# Patient Record
Sex: Male | Born: 1955 | Race: Black or African American | Hispanic: No | Marital: Single | Smoking: Never smoker
Health system: Southern US, Community
[De-identification: ages and names within clinical notes are randomized; demographics above are authoritative.]

## PROBLEM LIST (undated history)

## (undated) DIAGNOSIS — I10 Essential (primary) hypertension: Secondary | ICD-10-CM

## (undated) DIAGNOSIS — H409 Unspecified glaucoma: Secondary | ICD-10-CM

## (undated) DIAGNOSIS — E119 Type 2 diabetes mellitus without complications: Secondary | ICD-10-CM

## (undated) HISTORY — PX: EYE SURGERY: SHX253

## (undated) HISTORY — PX: HERNIA REPAIR: SHX51

---

## 2019-05-25 ENCOUNTER — Emergency Department (HOSPITAL_COMMUNITY): Payer: Medicaid Other

## 2019-05-25 ENCOUNTER — Observation Stay (HOSPITAL_COMMUNITY): Payer: Medicaid Other

## 2019-05-25 ENCOUNTER — Encounter (HOSPITAL_COMMUNITY): Payer: Self-pay | Admitting: Neurology

## 2019-05-25 ENCOUNTER — Observation Stay (HOSPITAL_COMMUNITY)
Admission: EM | Admit: 2019-05-25 | Discharge: 2019-05-26 | Disposition: A | Discharge status: Home / Self Care | Payer: Medicaid Other | Attending: Internal Medicine | Admitting: Internal Medicine

## 2019-05-25 DIAGNOSIS — R079 Chest pain, unspecified: Secondary | ICD-10-CM

## 2019-05-25 DIAGNOSIS — R531 Weakness: Secondary | ICD-10-CM | POA: Insufficient documentation

## 2019-05-25 DIAGNOSIS — I160 Hypertensive urgency: Secondary | ICD-10-CM | POA: Diagnosis not present

## 2019-05-25 DIAGNOSIS — F1910 Other psychoactive substance abuse, uncomplicated: Secondary | ICD-10-CM | POA: Insufficient documentation

## 2019-05-25 DIAGNOSIS — M25562 Pain in left knee: Secondary | ICD-10-CM | POA: Diagnosis not present

## 2019-05-25 DIAGNOSIS — W1830XA Fall on same level, unspecified, initial encounter: Secondary | ICD-10-CM | POA: Insufficient documentation

## 2019-05-25 DIAGNOSIS — N179 Acute kidney failure, unspecified: Secondary | ICD-10-CM | POA: Diagnosis not present

## 2019-05-25 DIAGNOSIS — Z0184 Encounter for antibody response examination: Secondary | ICD-10-CM | POA: Insufficient documentation

## 2019-05-25 DIAGNOSIS — I10 Essential (primary) hypertension: Secondary | ICD-10-CM | POA: Insufficient documentation

## 2019-05-25 DIAGNOSIS — R202 Paresthesia of skin: Secondary | ICD-10-CM | POA: Diagnosis not present

## 2019-05-25 DIAGNOSIS — R197 Diarrhea, unspecified: Secondary | ICD-10-CM | POA: Diagnosis not present

## 2019-05-25 DIAGNOSIS — H42 Glaucoma in diseases classified elsewhere: Secondary | ICD-10-CM | POA: Diagnosis not present

## 2019-05-25 DIAGNOSIS — N400 Enlarged prostate without lower urinary tract symptoms: Secondary | ICD-10-CM

## 2019-05-25 DIAGNOSIS — Z791 Long term (current) use of non-steroidal anti-inflammatories (NSAID): Secondary | ICD-10-CM | POA: Insufficient documentation

## 2019-05-25 DIAGNOSIS — Z79899 Other long term (current) drug therapy: Secondary | ICD-10-CM | POA: Diagnosis not present

## 2019-05-25 DIAGNOSIS — Z8249 Family history of ischemic heart disease and other diseases of the circulatory system: Secondary | ICD-10-CM | POA: Diagnosis not present

## 2019-05-25 DIAGNOSIS — R0789 Other chest pain: Secondary | ICD-10-CM | POA: Diagnosis not present

## 2019-05-25 DIAGNOSIS — E1139 Type 2 diabetes mellitus with other diabetic ophthalmic complication: Secondary | ICD-10-CM | POA: Insufficient documentation

## 2019-05-25 DIAGNOSIS — Z20828 Contact with and (suspected) exposure to other viral communicable diseases: Secondary | ICD-10-CM | POA: Diagnosis not present

## 2019-05-25 DIAGNOSIS — R338 Other retention of urine: Secondary | ICD-10-CM | POA: Diagnosis not present

## 2019-05-25 DIAGNOSIS — R55 Syncope and collapse: Secondary | ICD-10-CM | POA: Diagnosis not present

## 2019-05-25 DIAGNOSIS — E1165 Type 2 diabetes mellitus with hyperglycemia: Secondary | ICD-10-CM | POA: Diagnosis not present

## 2019-05-25 DIAGNOSIS — R739 Hyperglycemia, unspecified: Secondary | ICD-10-CM

## 2019-05-25 DIAGNOSIS — N289 Disorder of kidney and ureter, unspecified: Secondary | ICD-10-CM

## 2019-05-25 DIAGNOSIS — F1410 Cocaine abuse, uncomplicated: Secondary | ICD-10-CM | POA: Diagnosis present

## 2019-05-25 DIAGNOSIS — M25561 Pain in right knee: Secondary | ICD-10-CM | POA: Diagnosis not present

## 2019-05-25 DIAGNOSIS — N401 Enlarged prostate with lower urinary tract symptoms: Secondary | ICD-10-CM | POA: Diagnosis not present

## 2019-05-25 HISTORY — DX: Unspecified glaucoma: H40.9

## 2019-05-25 HISTORY — DX: Type 2 diabetes mellitus without complications: E11.9

## 2019-05-25 HISTORY — DX: Essential (primary) hypertension: I10

## 2019-05-25 LAB — DIFFERENTIAL
Abs Immature Granulocytes: 0.01 10*3/uL (ref 0.00–0.07)
Basophils Absolute: 0 10*3/uL (ref 0.0–0.1)
Basophils Relative: 1 %
Eosinophils Absolute: 0.1 10*3/uL (ref 0.0–0.5)
Eosinophils Relative: 2 %
Immature Granulocytes: 0 %
Lymphocytes Relative: 19 %
Lymphs Abs: 0.9 10*3/uL (ref 0.7–4.0)
Monocytes Absolute: 0.8 10*3/uL (ref 0.1–1.0)
Monocytes Relative: 16 %
Neutro Abs: 3 10*3/uL (ref 1.7–7.7)
Neutrophils Relative %: 62 %

## 2019-05-25 LAB — RAPID URINE DRUG SCREEN, HOSP PERFORMED
Amphetamines: NOT DETECTED
Barbiturates: NOT DETECTED
Benzodiazepines: NOT DETECTED
Cocaine: POSITIVE — AB
Opiates: NOT DETECTED
Tetrahydrocannabinol: NOT DETECTED

## 2019-05-25 LAB — I-STAT CHEM 8, ED
BUN: 30 mg/dL — ABNORMAL HIGH (ref 8–23)
Calcium, Ion: 1.18 mmol/L (ref 1.15–1.40)
Chloride: 106 mmol/L (ref 98–111)
Creatinine, Ser: 1.6 mg/dL — ABNORMAL HIGH (ref 0.61–1.24)
Glucose, Bld: 161 mg/dL — ABNORMAL HIGH (ref 70–99)
HCT: 33 % — ABNORMAL LOW (ref 39.0–52.0)
Hemoglobin: 11.2 g/dL — ABNORMAL LOW (ref 13.0–17.0)
Potassium: 4.3 mmol/L (ref 3.5–5.1)
Sodium: 138 mmol/L (ref 135–145)
TCO2: 23 mmol/L (ref 22–32)

## 2019-05-25 LAB — COMPREHENSIVE METABOLIC PANEL
ALT: 25 U/L (ref 0–44)
AST: 31 U/L (ref 15–41)
Albumin: 4.2 g/dL (ref 3.5–5.0)
Alkaline Phosphatase: 76 U/L (ref 38–126)
Anion gap: 9 (ref 5–15)
BUN: 31 mg/dL — ABNORMAL HIGH (ref 8–23)
CO2: 23 mmol/L (ref 22–32)
Calcium: 9.3 mg/dL (ref 8.9–10.3)
Chloride: 105 mmol/L (ref 98–111)
Creatinine, Ser: 1.67 mg/dL — ABNORMAL HIGH (ref 0.61–1.24)
GFR calc Af Amer: 50 mL/min — ABNORMAL LOW (ref >60)
GFR calc non Af Amer: 43 mL/min — ABNORMAL LOW (ref >60)
Glucose, Bld: 169 mg/dL — ABNORMAL HIGH (ref 70–99)
Potassium: 4.3 mmol/L (ref 3.5–5.1)
Sodium: 137 mmol/L (ref 135–145)
Total Bilirubin: 0.8 mg/dL (ref 0.3–1.2)
Total Protein: 6.9 g/dL (ref 6.5–8.1)

## 2019-05-25 LAB — CK: Total CK: 401 U/L — ABNORMAL HIGH (ref 49–397)

## 2019-05-25 LAB — CBC
HCT: 32.1 % — ABNORMAL LOW (ref 39.0–52.0)
Hemoglobin: 10.1 g/dL — ABNORMAL LOW (ref 13.0–17.0)
MCH: 25.1 pg — ABNORMAL LOW (ref 26.0–34.0)
MCHC: 31.5 g/dL (ref 30.0–36.0)
MCV: 79.9 fL — ABNORMAL LOW (ref 80.0–100.0)
Platelets: 275 10*3/uL (ref 150–400)
RBC: 4.02 MIL/uL — ABNORMAL LOW (ref 4.22–5.81)
RDW: 13.8 % (ref 11.5–15.5)
WBC: 4.7 10*3/uL (ref 4.0–10.5)
nRBC: 0 % (ref 0.0–0.2)

## 2019-05-25 LAB — APTT: aPTT: 25 seconds (ref 24–36)

## 2019-05-25 LAB — TROPONIN I (HIGH SENSITIVITY)
Troponin I (High Sensitivity): 8 ng/L (ref <18)
Troponin I (High Sensitivity): 8 ng/L (ref <18)

## 2019-05-25 LAB — PROTIME-INR
INR: 1.1 (ref 0.8–1.2)
Prothrombin Time: 13.6 seconds (ref 11.4–15.2)

## 2019-05-25 LAB — CBG MONITORING, ED: Glucose-Capillary: 163 mg/dL — ABNORMAL HIGH (ref 70–99)

## 2019-05-25 LAB — ETHANOL: Alcohol, Ethyl (B): <10 mg/dL (ref <10)

## 2019-05-25 LAB — SARS CORONAVIRUS 2 BY RT PCR (HOSPITAL ORDER, PERFORMED IN ~~LOC~~ HOSPITAL LAB): SARS Coronavirus 2: NEGATIVE

## 2019-05-25 MED ORDER — SODIUM CHLORIDE 0.9% FLUSH: 3.0000 mL | Freq: Once | INTRAVENOUS | Status: DC

## 2019-05-25 MED ORDER — ONDANSETRON HCL 4 MG/2ML IJ SOLN: 4.0000 mg | Freq: Four times a day (QID) | INTRAMUSCULAR | Status: DC | PRN

## 2019-05-25 MED ORDER — DICLOFENAC SODIUM 1 % TD GEL
2.0000 g | Freq: Four times a day (QID) | TRANSDERMAL | Status: DC
  Administered 2019-05-26 (×2): 2 g via TOPICAL
  Filled 2019-05-25 (×2): qty 100

## 2019-05-25 MED ORDER — ONDANSETRON HCL 4 MG PO TABS: 4.0000 mg | ORAL_TABLET | Freq: Four times a day (QID) | ORAL | Status: DC | PRN

## 2019-05-25 MED ORDER — ACETAMINOPHEN 325 MG PO TABS: 650.0000 mg | ORAL_TABLET | Freq: Four times a day (QID) | ORAL | Status: DC | PRN

## 2019-05-25 MED ORDER — SODIUM CHLORIDE 0.9 % IV BOLUS
1000.0000 mL | Freq: Once | INTRAVENOUS | Status: AC
  Administered 2019-05-25: 1000 mL via INTRAVENOUS

## 2019-05-25 MED ORDER — VITAMIN B-1 100 MG PO TABS
100.0000 mg | ORAL_TABLET | Freq: Every day | ORAL | Status: DC
  Administered 2019-05-26: 100 mg via ORAL
  Filled 2019-05-25: qty 1

## 2019-05-25 MED ORDER — SODIUM CHLORIDE 0.9% FLUSH: 3.0000 mL | Freq: Two times a day (BID) | INTRAVENOUS | Status: DC

## 2019-05-25 MED ORDER — TAMSULOSIN HCL 0.4 MG PO CAPS
0.4000 mg | ORAL_CAPSULE | Freq: Every day | ORAL | Status: DC
  Administered 2019-05-26: 0.4 mg via ORAL
  Filled 2019-05-25: qty 1

## 2019-05-25 MED ORDER — ALBUTEROL SULFATE (2.5 MG/3ML) 0.083% IN NEBU: 2.5000 mg | INHALATION_SOLUTION | Freq: Four times a day (QID) | RESPIRATORY_TRACT | Status: DC | PRN

## 2019-05-25 MED ORDER — ASPIRIN 325 MG PO TABS
325.0000 mg | ORAL_TABLET | Freq: Once | ORAL | Status: AC
  Administered 2019-05-25: 325 mg via ORAL
  Filled 2019-05-25: qty 1

## 2019-05-25 MED ORDER — IOHEXOL 350 MG/ML SOLN
125.0000 mL | Freq: Once | INTRAVENOUS | Status: AC | PRN
  Administered 2019-05-25: 125 mL via INTRAVENOUS

## 2019-05-25 MED ORDER — ISONIAZID 300 MG PO TABS
300.0000 mg | ORAL_TABLET | Freq: Every day | ORAL | Status: DC
  Administered 2019-05-26: 300 mg via ORAL
  Filled 2019-05-25 (×2): qty 1

## 2019-05-25 MED ORDER — LORAZEPAM 1 MG PO TABS: 1.0000 mg | ORAL_TABLET | ORAL | Status: DC | PRN

## 2019-05-25 MED ORDER — SODIUM CHLORIDE 0.9 % IV SOLN
INTRAVENOUS | Status: DC
  Administered 2019-05-25: 23:00:00 via INTRAVENOUS

## 2019-05-25 MED ORDER — ENOXAPARIN SODIUM 40 MG/0.4ML ~~LOC~~ SOLN
40.0000 mg | SUBCUTANEOUS | Status: DC
  Administered 2019-05-26: 11:00:00 40 mg via SUBCUTANEOUS
  Filled 2019-05-25 (×2): qty 0.4

## 2019-05-25 MED ORDER — HYDRALAZINE HCL 20 MG/ML IJ SOLN: 10.0000 mg | INTRAMUSCULAR | Status: DC | PRN

## 2019-05-25 MED ORDER — ACETAMINOPHEN 650 MG RE SUPP: 650.0000 mg | Freq: Four times a day (QID) | RECTAL | Status: DC | PRN

## 2019-05-25 MED ORDER — LATANOPROST 0.005 % OP SOLN
1.0000 [drp] | Freq: Every day | OPHTHALMIC | Status: DC
  Administered 2019-05-26: 1 [drp] via OPHTHALMIC
  Filled 2019-05-25 (×2): qty 2.5

## 2019-05-25 MED ORDER — LORAZEPAM 2 MG/ML IJ SOLN: 1.0000 mg | INTRAMUSCULAR | Status: DC | PRN

## 2019-05-25 MED ORDER — FOLIC ACID 1 MG PO TABS
1.0000 mg | ORAL_TABLET | Freq: Every day | ORAL | Status: DC
  Administered 2019-05-26: 1 mg via ORAL
  Filled 2019-05-25: qty 1

## 2019-05-25 MED ORDER — VITAMIN B-6 100 MG PO TABS
100.0000 mg | ORAL_TABLET | Freq: Every day | ORAL | Status: DC
  Administered 2019-05-26: 100 mg via ORAL
  Filled 2019-05-25 (×2): qty 1

## 2019-05-25 MED ORDER — ADULT MULTIVITAMIN W/MINERALS CH
1.0000 | ORAL_TABLET | Freq: Every day | ORAL | Status: DC
  Administered 2019-05-26: 11:00:00 1 via ORAL
  Filled 2019-05-25: qty 1

## 2019-05-25 MED ORDER — THIAMINE HCL 100 MG/ML IJ SOLN: 100.0000 mg | Freq: Every day | INTRAMUSCULAR | Status: DC

## 2019-05-25 NOTE — ED Provider Notes (Signed)
[facility]   CSN: [id number] Arrival date & time: 05/25/19  1242  An emergency department physician performed an initial assessment on this suspected stroke patient at 1304.  History   Chief Complaint Chief Complaint  Patient presents with   Code Stroke    HPI Timothy Donovan is a 63 y.o. male.     Timothy Donovan is a 63 y.o. male with a history of diabetes, hypertension and right eye glaucoma, who presents to the emergency department for evaluation of sudden chest pain and left-sided weakness and numbness.  Last seen normal was at 10 AM today.  Patient reports after that he was walking and developed left-sided chest pain and then started to notice weakness and numbness in his left arm and leg and then fell to the ground.  Patient reports he thinks he passed out and someone driving by picked him up and put them in his car and brought him to the hospital, although he reports he does not remember the ride to the hospital.  He remembers waking up upon arriving here.  He continues to complain of left-sided chest pain, weakness and numbness.  Denies new vision changes reports chronic right-sided vision changes due to glaucoma.  Denies headache.  Reports chest pain does radiate into his abdomen.  No nausea or vomiting.  No diarrhea.  No shortness of breath, fevers or cough.  No known sick contacts.  Denies prior history of stroke.  Initially denies any drug use and reports intermittent alcohol use, denies smoking.  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     Past Medical History:  Diagnosis Date   Diabetes mellitus without complication (HCC)    "pre diabetes"    Glaucoma    Right eye   Hypertension     There are no active problems to display for this patient.   Past Surgical History:  Procedure Laterality Date   EYE SURGERY Right    HERNIA REPAIR          Home Medications    Prior to Admission medications   Medication Sig  Start Date End Date Taking? Authorizing Provider  ibuprofen (ADVIL) 800 MG tablet Take 800 mg by mouth 3 (three) times daily as needed for mild pain or moderate pain.   Yes [provider]  isoniazid (NYDRAZID) 300 MG tablet Take 300 mg by mouth daily. Take with vitamin B6 (Pyridoxine)   Yes [provider]  latanoprost (XALATAN) 0.005 % ophthalmic solution Place 1 drop into both eyes at bedtime.   Yes [provider]  naproxen (NAPROSYN) 500 MG tablet Take 500 mg by mouth 2 (two) times daily as needed for mild pain or moderate pain.   Yes [provider]  pyridoxine (B-6) 100 MG tablet Take 100 mg by mouth daily.   Yes [provider]  tamsulosin (FLOMAX) 0.4 MG CAPS capsule Take 0.4 mg by mouth at bedtime.   Yes [provider]    Family History Family History  Problem Relation Age of Onset   Hypertension Mother    Hypertension Father     Social History Social History   Tobacco Use   Smoking status: Never Smoker  Substance Use Topics   Alcohol use: Yes    Comment: Occassional    Drug use: Not on file     Allergies   Patient has no known allergies.   Review of Systems Review of Systems  Constitutional: Negative for chills and  fever.  HENT: Negative.   Eyes: Negative for visual disturbance.  Respiratory: Negative for cough and shortness of breath.   Cardiovascular: Positive for chest pain.  Gastrointestinal: Positive for abdominal pain. Negative for diarrhea, nausea and vomiting.  Genitourinary: Negative for dysuria and frequency.  Musculoskeletal: Negative for arthralgias and myalgias.  Skin: Negative for color change and rash.  Neurological: Positive for syncope, weakness and numbness. Negative for dizziness, light-headedness and headaches.  All other systems reviewed and are negative.    Physical Exam Updated Vital Signs BP (!) 162/56    Pulse 63    Temp 98.7 F (37.1 C) (Oral)    Resp (!) 21    Wt 92.7  kg    SpO2 100%   Physical Exam Vitals signs and nursing note reviewed.  Constitutional:      General: He is in acute distress.     Appearance: He is well-developed. He is obese. He is not diaphoretic.     Comments: On arrival patient is alert, appears uncomfortable and has difficulty concentrating to answer questions.  HENT:     Head: Normocephalic and atraumatic.     Mouth/Throat:     Mouth: Mucous membranes are moist.     Pharynx: Oropharynx is clear.  Eyes:     General:        Right eye: No discharge.        Left eye: No discharge.     Extraocular Movements: Extraocular movements intact.     Conjunctiva/sclera: Conjunctivae normal.     Pupils: Pupils are equal, round, and reactive to light.  Neck:     Musculoskeletal: Neck supple.  Cardiovascular:     Rate and Rhythm: Normal rate and regular rhythm.     Pulses: Normal pulses.     Heart sounds: Normal heart sounds. No murmur. No friction rub. No gallop.      Comments: Pulses intact and equal in all extremities.  Heart RRR Pulmonary:     Effort: Pulmonary effort is normal. No respiratory distress.     Breath sounds: Normal breath sounds. No wheezing or rales.     Comments: Respirations equal and unlabored, patient able to speak in full sentences, lungs clear to auscultation bilaterally Abdominal:     General: Bowel sounds are normal. There is no distension.     Palpations: Abdomen is soft. There is no mass.     Tenderness: There is no abdominal tenderness. There is no guarding.     Comments: Abdomen soft, nondistended, nontender to palpation in all quadrants without guarding or peritoneal signs  Musculoskeletal:        General: No deformity.  Skin:    General: Skin is warm and dry.     Capillary Refill: Capillary refill takes less than 2 seconds.  Neurological:     Mental Status: He is alert.     Coordination: Coordination normal.     Comments: Pt alert and able to follow commands Cranial Nerves: Visual fields grossly  intact. EOMI and PERRLA. No nystagmus noted. Facial sensation intact at forehead, maxillary cheek, and chin/mandible bilaterally. No facial asymmetry or weakness. Hearing grossly normal. Uvula is midline, and palate elevates symmetrically. Normal SCM and trapezius strength. Tongue midline without fasciculations. Motor: Tone is normal.  Patient able to lift bilateral legs approximately 4 inches off the bed with no drift, patient able to lift bilateral arms off the table approximately 3 inches but secondary to pain says he cannot do further.  At one point patient  was distracted and was able to lift his left arm as if he was going to itch his nose Reflexes: 2+ and symmetrical in all four extremities.  Sensation: Patient reports decreased sensation to the left side of his face, left arm and left lower extremity.   Coordination: FNF showed no dysmetria and when hand was held over his head it was very volitional and would not let his finger fall to touch his nose.  Was not able to take part in heel-to-shin secondary to pain  Psychiatric:        Mood and Affect: Mood normal.        Behavior: Behavior normal.      ED Treatments / Results  Labs (all labs ordered are listed, but only abnormal results are displayed) Labs Reviewed  CBC - Abnormal; Notable for the following components:      Result Value   RBC 4.02 (*)    Hemoglobin 10.1 (*)    HCT 32.1 (*)    MCV 79.9 (*)    MCH 25.1 (*)    All other components within normal limits  COMPREHENSIVE METABOLIC PANEL - Abnormal; Notable for the following components:   Glucose, Bld 169 (*)    BUN 31 (*)    Creatinine, Ser 1.67 (*)    GFR calc non Af Amer 43 (*)    GFR calc Af Amer 50 (*)    All other components within normal limits  RAPID URINE DRUG SCREEN, HOSP PERFORMED - Abnormal; Notable for the following components:   Cocaine POSITIVE (*)    All other components within normal limits  I-STAT CHEM 8, ED - Abnormal; Notable for the following  components:   BUN 30 (*)    Creatinine, Ser 1.60 (*)    Glucose, Bld 161 (*)    Hemoglobin 11.2 (*)    HCT 33.0 (*)    All other components within normal limits  CBG MONITORING, ED - Abnormal; Notable for the following components:   Glucose-Capillary 163 (*)    All other components within normal limits  SARS CORONAVIRUS 2 (HOSPITAL ORDER, PERFORMED IN [facility])  PROTIME-INR  APTT  DIFFERENTIAL  ETHANOL  CK  TROPONIN I (HIGH SENSITIVITY)  TROPONIN I (HIGH SENSITIVITY)    EKG EKG Interpretation  Date/Time:  Wednesday May 25 2019 12:48:20 EDT Ventricular Rate:  70 PR Interval:  120 QRS Duration: 118 QT Interval:  420 QTC Calculation: 453 R Axis:   120 Text Interpretation:  Normal sinus rhythm Right bundle branch block Abnormal ECG No old tracing to compare Confirmed by Lorre Nick ([id number]) on 05/25/2019 1:08:44 PM   Radiology Ct Angio Head W Or Wo Contrast  Result Date: 05/25/2019 CLINICAL DATA:  63 year old male with left side facial droop and numbness, last seen normal 1000 hours. EXAM: CT ANGIOGRAPHY HEAD AND NECK TECHNIQUE: Multidetector CT imaging of the head and neck was performed using the standard protocol during bolus administration of intravenous contrast. Multiplanar CT image reconstructions and MIPs were obtained to evaluate the vascular anatomy. Carotid stenosis measurements (when applicable) are obtained utilizing NASCET criteria, using the distal internal carotid diameter as the denominator. CONTRAST:  OMNIPAQUE IOHEXOL 350 MG/ML SOLN COMPARISON:  Plain head CT 1313 hours today. FINDINGS: CTA NECK Skeleton: No acute osseous abnormality identified. Upper chest: Negative. Other neck: Small right thyroid lobe nodule does not meet size criteria for ultrasound follow-up. Otherwise negative. Aortic arch: 3 vessel arch configuration with no arch atherosclerosis. Right carotid system: Negative brachiocephalic  artery and right CCA origin. Soft plaque  in the medial vessel at the level of the larynx on series 508, image 128 without significant stenosis. Negative right carotid bifurcation and cervical right ICA. Left carotid system: Negative left CCA origin. Soft plaque in the medial vessel at the level of the larynx without significant stenosis. Mild soft and calcified plaque at the lateral left ICA origin and the bulb. No stenosis to the skull base. Vertebral arteries: Negative proximal right subclavian artery and right vertebral artery origin. Negative right vertebral artery to the skull base. Negative proximal left subclavian artery and left vertebral artery origin. Patent left vertebral artery to the skull base without stenosis. CTA HEAD Posterior circulation: Codominant distal vertebral arteries without stenosis. Normal right PICA origin. The left AICA appears dominant. Patent vertebrobasilar junction. Diminutive basilar artery is patent without focal stenosis. Patent SCA origins. Fetal type PCA origins, more so the left. Bilateral PCA branches are mildly irregular, with mild stenosis of the right P2 segment on series 513, image 20. Anterior circulation: Both ICA siphons are patent. On the left there is mild plaque without stenosis. Normal left ophthalmic and posterior communicating artery origins. On the right there is mostly cavernous segment calcified plaque with only mild stenosis. Normal right posterior communicating artery origin. Patent carotid termini. Bilateral MCA and ACA origins are within normal limits. Diminutive or absent anterior communicating artery. Bilateral ACA branches are within normal limits. Left MCA M1 segment and bifurcation are patent without stenosis. Left MCA branches are within normal limits. Right MCA M1 segment and trifurcation are patent without stenosis. Right MCA branches are within normal limits. Venous sinuses: Patent. Anatomic variants: Fetal type bilateral PCA origins and diminutive basilar artery. Review of the MIP  images confirms the above findings IMPRESSION: Negative for large vessel occlusion. Mild for age atherosclerosis in the head and neck, most notably soft plaque in both CCAs, with no hemodynamically significant stenosis. Electronically Signed   By: Odessa Fleming M.D.   On: 05/25/2019 14:18   Ct Angio Neck W Or Wo Contrast  Result Date: 05/25/2019 CLINICAL DATA:  63 year old male with left side facial droop and numbness, last seen normal 1000 hours. EXAM: CT ANGIOGRAPHY HEAD AND NECK TECHNIQUE: Multidetector CT imaging of the head and neck was performed using the standard protocol during bolus administration of intravenous contrast. Multiplanar CT image reconstructions and MIPs were obtained to evaluate the vascular anatomy. Carotid stenosis measurements (when applicable) are obtained utilizing NASCET criteria, using the distal internal carotid diameter as the denominator. CONTRAST:  OMNIPAQUE IOHEXOL 350 MG/ML SOLN COMPARISON:  Plain head CT 1313 hours today. FINDINGS: CTA NECK Skeleton: No acute osseous abnormality identified. Upper chest: Negative. Other neck: Small right thyroid lobe nodule does not meet size criteria for ultrasound follow-up. Otherwise negative. Aortic arch: 3 vessel arch configuration with no arch atherosclerosis. Right carotid system: Negative brachiocephalic artery and right CCA origin. Soft plaque in the medial vessel at the level of the larynx on series 508, image 128 without significant stenosis. Negative right carotid bifurcation and cervical right ICA. Left carotid system: Negative left CCA origin. Soft plaque in the medial vessel at the level of the larynx without significant stenosis. Mild soft and calcified plaque at the lateral left ICA origin and the bulb. No stenosis to the skull base. Vertebral arteries: Negative proximal right subclavian artery and right vertebral artery origin. Negative right vertebral artery to the skull base. Negative proximal left subclavian artery and left  vertebral artery origin. Patent  left vertebral artery to the skull base without stenosis. CTA HEAD Posterior circulation: Codominant distal vertebral arteries without stenosis. Normal right PICA origin. The left AICA appears dominant. Patent vertebrobasilar junction. Diminutive basilar artery is patent without focal stenosis. Patent SCA origins. Fetal type PCA origins, more so the left. Bilateral PCA branches are mildly irregular, with mild stenosis of the right P2 segment on series 513, image 20. Anterior circulation: Both ICA siphons are patent. On the left there is mild plaque without stenosis. Normal left ophthalmic and posterior communicating artery origins. On the right there is mostly cavernous segment calcified plaque with only mild stenosis. Normal right posterior communicating artery origin. Patent carotid termini. Bilateral MCA and ACA origins are within normal limits. Diminutive or absent anterior communicating artery. Bilateral ACA branches are within normal limits. Left MCA M1 segment and bifurcation are patent without stenosis. Left MCA branches are within normal limits. Right MCA M1 segment and trifurcation are patent without stenosis. Right MCA branches are within normal limits. Venous sinuses: Patent. Anatomic variants: Fetal type bilateral PCA origins and diminutive basilar artery. Review of the MIP images confirms the above findings IMPRESSION: Negative for large vessel occlusion. Mild for age atherosclerosis in the head and neck, most notably soft plaque in both CCAs, with no hemodynamically significant stenosis. Electronically Signed   By: Odessa FlemingH  Hall M.D.   On: 05/25/2019 14:18   Mr Brain Wo Contrast  Result Date: 05/25/2019 CLINICAL DATA:  Stroke.  Left facial droop and numbness. EXAM: MRI HEAD WITHOUT CONTRAST TECHNIQUE: Multiplanar, multiecho pulse sequences of the brain and surrounding structures were obtained without intravenous contrast. COMPARISON:  CT head 05/25/2019 FINDINGS: Brain:  Artifact over the face and frontal lobe due to dental hardware Negative for acute infarct. Ventricle size and cerebral volume normal for age. Numerous hyperintensities in the deep white matter bilaterally. Brainstem and cerebellum normal. Negative for hemorrhage or mass. Vascular: Normal arterial flow voids Skull and upper cervical spine: Negative Sinuses/Orbits: Negative Other: None IMPRESSION: Negative for acute infarct Moderate chronic microvascular ischemic changes in the white matter. Electronically Signed   By: Marlan Palauharles  Clark M.D.   On: 05/25/2019 15:16   Ct Angio Chest/abd/pel For Dissection W And/or W/wo  Result Date: 05/25/2019 CLINICAL DATA:  Chest and back pain with left-sided numbness. Aortic dissection suspected. EXAM: CT ANGIOGRAPHY CHEST, ABDOMEN AND PELVIS TECHNIQUE: Multidetector CT imaging through the chest, abdomen and pelvis was performed using the standard protocol during bolus administration of intravenous contrast. Multiplanar reconstructed images and MIPs were obtained and reviewed to evaluate the vascular anatomy. CONTRAST:  125mL OMNIPAQUE IOHEXOL 350 MG/ML SOLN COMPARISON:  None. FINDINGS: CTA CHEST FINDINGS Cardiovascular: Pre contrast images demonstrate faint coronary artery calcifications. There are no displaced intimal calcifications within the aorta. Following contrast, the aorta enhances normally. There is no evidence of dissection or aneurysm. The pulmonary arteries are well opacified with contrast. No evidence of acute pulmonary embolism. The heart size is normal. There is no pericardial effusion. Mediastinum/Nodes: There are no enlarged mediastinal, hilar or axillary lymph nodes. There is mild heterogeneity of the right thyroid lobe without focally suspicious lesion. Trachea appears normal. There is a small hiatal hernia. Lungs/Pleura: There is no pleural effusion or pneumothorax. The lungs are clear. Musculoskeletal: No chest wall mass or suspicious osseous findings. Review of  the MIP images confirms the above findings. CTA ABDOMEN AND PELVIS FINDINGS VASCULAR Aorta: Normal.  No evidence of aneurysm or dissection. Celiac: Normal with conventional branching pattern. SMA: Normal. Renals: Normal IMA: Normal  Inflow: Normal. Veins: Suboptimally opacified.  No demonstrated abnormality. Review of the MIP images confirms the above findings. NON-VASCULAR Hepatobiliary: Possible mild hepatic steatosis. No focal lesion or abnormal enhancement identified. The gallbladder and biliary system appear unremarkable. Pancreas: Unremarkable. No evidence of pancreatic mass, ductal dilatation or surrounding inflammation. Spleen: Normal in size without focal abnormality. Adrenals/Urinary Tract: Both adrenal glands appear normal. The pre contrast images demonstrate contrast material within the renal collecting systems attributed to neck CTA performed earlier. There are tiny bilateral renal cysts. No evidence of hydronephrosis. The bladder appears unremarkable. Stomach/Bowel: No bowel wall thickening, distention or surrounding inflammation. The appendix appears normal. Lymphatic: No enlarged abdominopelvic lymph nodes. Reproductive: The prostate gland and seminal vesicles appear normal. Other: No ascites or free air. Musculoskeletal: No acute osseous findings or abdominal wall masses. Review of the MIP images confirms the above findings. IMPRESSION: 1. No acute vascular findings in the chest, abdomen or pelvis. No evidence of aortic dissection. Mild coronary artery atherosclerosis. 2. No acute findings in the chest, abdomen or pelvis. 3. Tiny bilateral renal cysts. Electronically Signed   By: Richardean Sale M.D.   On: 05/25/2019 14:17   Ct Head Code Stroke Wo Contrast  Result Date: 05/25/2019 CLINICAL DATA:  Code stroke. 63 year old male with left-side facial droop and numbness last seen normal 1000 hours. EXAM: CT HEAD WITHOUT CONTRAST TECHNIQUE: Contiguous axial images were obtained from the base of the  skull through the vertex without intravenous contrast. COMPARISON:  [facility] head CT 02/04/2019. FINDINGS: Brain: Patchy bilateral cerebral white matter hypodensity appears stable. No midline shift, mass effect, or evidence of intracranial mass lesion. No acute intracranial hemorrhage identified. No cortically based acute infarct identified. No ventriculomegaly. Vascular: Calcified atherosclerosis at the skull base. No suspicious intracranial vascular hyperdensity. Skull: Stable.  No acute osseous abnormality identified. Sinuses/Orbits: Visualized paranasal sinuses and mastoids are clear. Other: No acute orbit or scalp soft tissue findings. ASPECTS [facility] Stroke Program Early CT Score) Total score (0-10 with 10 being normal): 10 IMPRESSION: 1. Stable non contrast CT appearance of the brain since May with nonspecific white matter disease. 2. ASPECTS 10. 3. These results were communicated to Dr. Rory Percy at 1:17 pm on 05/25/2019 by text page via the [facility] messaging system. Electronically Signed   By: Genevie Ann M.D.   On: 05/25/2019 13:18    Procedures .Critical Care Performed by: Jacqlyn Larsen, PA-C Authorized by: Jacqlyn Larsen, PA-C   Critical care provider statement:    Critical care time (minutes):  45   Critical care was necessary to treat or prevent imminent or life-threatening deterioration of the following conditions:  CNS failure or compromise and circulatory failure (Presented as CODE stroke)   Critical care was time spent personally by me on the following activities:  Discussions with consultants, evaluation of patient's response to treatment, examination of patient, ordering and performing treatments and interventions, ordering and review of laboratory studies, ordering and review of radiographic studies, pulse oximetry, re-evaluation of patient's condition, obtaining history from patient or surrogate and review of old charts   (including critical care  time)  Medications Ordered in ED Medications  sodium chloride flush (NS) 0.9 % injection 3 mL (has no administration in time range)  aspirin tablet 325 mg (has no administration in time range)  sodium chloride 0.9 % bolus 1,000 mL (has no administration in time range)  0.9 %  sodium chloride infusion (has no administration in time range)  iohexol (  OMNIPAQUE) 350 MG/ML injection 125 mL (125 mLs Intravenous Contrast Given 05/25/19 1335)     Initial Impression / Assessment and Plan / ED Course  I have reviewed the triage vital signs and the nursing notes.  Pertinent labs & imaging results that were available during my care of the patient were reviewed by me and considered in my medical decision making (see chart for details).  63 year old male presented POV with reports of chest pain and left-sided weakness and numbness.  Code stroke initiated in triage, patient evaluated by Dr. Wilford Corner with neurology, inconsistent neuro exam, patient does intermittently exhibit some left-sided weakness, but a lot of this seems to be effort dependent, he does endorse decreased sensation and had some slurred speech on arrival.  Initial head CT negative for bleed.  Will proceed with CTA head and neck, given chest pain associated with onset of symptoms, and syncopal episode reported by patient there is also concern for dissection, will get CTA dissection study.  EKG on arrival without concerning ischemic changes. Stroke labs, UDS and troponin ordered as well.  CTA of the head and neck does not reveal a large vessel occlusion.  Neurology recommends proceeding with MRI to rule out stroke.  Neuro exam remains inconsistent.  CT dissection study performed while in CT as well, no evidence of dissection or other acute abnormality within the chest.  Incidental finding of 2 small renal cysts.  Lab evaluation significant for creatinine of 1.67, no previous available for comparison, BUN slightly elevated as well.  Glucose 169, no  other acute electrolyte derangements, no leukocytosis, stable hemoglobin of 10.1.  Negative ethanol level, UDS is positive for cocaine which patient initially denied but does report that he used a few days ago.  Normal coags.  Initial high-sensitivity troponin of 8.  COVID swab pending.  Called by Dr. Wilford Corner with neurology, MRI negative for stroke, no further intervention recommended by neurology at this time.  Given significant episode of chest pain with associated syncope and known cocaine use do think that patient will need admission for further chest pain work-up with medicine team.  Will consult for unassigned medicine admission.  Patient given aspirin.  Chest pain has been steadily improving throughout ED stay.  4:12 PM Case discussed with Dr. Madelyn Flavors with [facility] hospitalist who will see and admit the patient, request CK be added onto lab work given renal insufficiency in the setting of cocaine use, and he will start the patient on IV fluids.   Patient discussed with Dr. Freida Busman, who saw patient as well and agrees with plan.   Final Clinical Impressions(s) / ED Diagnoses   Final diagnoses:  Left-sided chest pain  Renal insufficiency  Cocaine abuse [facility])  Paresthesias    ED Discharge Orders    None       Legrand Rams 05/25/19 1706    Lorre Nick, MD 05/31/19 1601

## 2019-05-25 NOTE — Code Documentation (Signed)
63yo male arriving to [facility] via private vehicle at 1242. Patient reports sudden onset left sided chest pain at 1000 followed by left sided numbness. Patient presented to the ED where a code stroke was activated. Stroke team to the bedside in CT. CT completed. NIHSS 6, see documentation for details and code stroke times. Patient with generalized weakness with patient reporting pain with movement. Patient with reported decreased sensation on the left side and stuttering speech. Of note, patient tearful with effort dependent motor exam. CTA head and neck and dissection study completed. Patient is too mild to treat with tPA at this time, however, patient remains in the window to treat with tPA until 1430 should symptoms worsen. Patient to go to STAT MRI once table available. Handoff with ED RN Clydene Fake.

## 2019-05-25 NOTE — ED Notes (Signed)
Pt asking for food  given

## 2019-05-25 NOTE — Progress Notes (Signed)
Consult request has been received. CSW attempting to follow up at present time  Auden Tatar M. Jaaron Oleson LCSWA Transitions of Care  Clinical Social Worker  Ph: [phone] 

## 2019-05-25 NOTE — ED Notes (Signed)
Urine culture tube sent down to lab 

## 2019-05-25 NOTE — ED Notes (Signed)
Pt taken to MRI by this RN.  

## 2019-05-25 NOTE — ED Notes (Signed)
The pt reports no pain but when trying to do neuro on him  He is hurting everywhere.  Some mild chest pain some lt shoulder pain and lower back pain

## 2019-05-25 NOTE — ED Notes (Signed)
Pt just arrived from mri.   Now he is going to ultra sound

## 2019-05-25 NOTE — Progress Notes (Signed)
MRI negative for stroke. No new recommendations. Neurology will sign off and be available as needed.  -- Amie Portland, MD [facility] Pager: [phone] If 7pm to 7am, please call on call as listed on AMION.

## 2019-05-25 NOTE — H&P (Addendum)
History and Physical    Timothy RadBobby J Salido ZOX:[id number] DOB: [birth year] DOA: 05/25/2019  Referring MD/NP/PA: Jodi GeraldsKelsey Ford, PA-C PCP: Lavinia SharpsPlacey, Mary Ann, NP  Patient coming from: Private vehicle  Chief Complaint: Chest pain  I have personally briefly reviewed patient[facility]   HPI: Timothy Donovan is a 63 y.o. male with medical history significant of hypertension, glaucoma of the right eye, and prediabetes; who presents with complaints of chest pain. History from the patient does not appear completely reliable and changed several times during the day.  Patient reports that he was walking down the street when he had acute onset of left-sided chest pain around 11 AM this morning.  States that he felt like someone was hitting him in the chest.  Thereafter, developed left-sided weakness and reported feeling lightheaded before falling to the ground.  Patient denies hitting his head but did sustain trauma to his left knee.  Reports a bystander got him into their car and drove him to the hospital.  This time he complains of right knee pain that he relates to the fall.  He admits to smoking cocaine approximately 3 days ago and makes him like this was his first time.  Other associated symptoms include complaints of abdominal discomfort and diarrhea.  Reports having 3-4 stools per day over the last 3 days  ED Course: Upon admission into the emergency department patient was noted to be afebrile, pulse 58-76, respiration 13-24, blood pressure 126/64-170 6/61, and O2 saturation maintained on room air.  Labs revealed WBC 4.7, hemoglobin 10.1, BUN 31, creatinine 1.67, glucose 169, and troponin negative x2.  Alcohol level was undetectable and urine drug screen positive for cocaine.  CT angiogram of the head neck and abdomen pelvis patient was given a full dose aspirin and TRH called to admit.  Review of Systems  Constitutional: Negative for chills, fever and weight loss.  HENT: Negative for  ear discharge and ear pain.   Eyes: Positive for blurred vision. Negative for discharge.  Respiratory: Negative for cough and sputum production.   Cardiovascular: Positive for chest pain. Negative for orthopnea and leg swelling.  Gastrointestinal: Positive for abdominal pain and diarrhea. Negative for blood in stool, nausea and vomiting.  Genitourinary: Negative for dysuria and hematuria.  Musculoskeletal: Positive for falls and joint pain.  Skin: Negative for itching.  Neurological: Positive for loss of consciousness. Negative for focal weakness.  Psychiatric/Behavioral: Positive for substance abuse.    Past Medical History:  Diagnosis Date   Diabetes mellitus without complication (HCC)    "pre diabetes"    Glaucoma    Right eye   Hypertension     Past Surgical History:  Procedure Laterality Date   EYE SURGERY Right    HERNIA REPAIR       reports that he has never smoked. He does not have any smokeless tobacco history on file. He reports current alcohol use. No history on file for drug.  No Known Allergies  Family History  Problem Relation Age of Onset   Hypertension Mother    Hypertension Father     Prior to Admission medications   Medication Sig Start Date End Date Taking? Authorizing Provider  ibuprofen (ADVIL) 800 MG tablet Take 800 mg by mouth 3 (three) times daily as needed for mild pain or moderate pain.   Yes [provider]  isoniazid (NYDRAZID) 300 MG tablet Take 300 mg by mouth daily. Take with vitamin B6 (Pyridoxine)   Yes [provider]  latanoprost (XALATAN) 0.005 % ophthalmic solution Place 1 drop into both eyes at bedtime.   Yes [provider]  naproxen (NAPROSYN) 500 MG tablet Take 500 mg by mouth 2 (two) times daily as needed for mild pain or moderate pain.   Yes [provider]  pyridoxine (B-6) 100 MG tablet Take 100 mg by mouth daily.   Yes [provider]  tamsulosin (FLOMAX) 0.4 MG CAPS capsule  Take 0.4 mg by mouth at bedtime.   Yes [provider]    Physical Exam:  Constitutional: Middle aged male who appears to be in NAD, calm, comfortable Vitals:   05/25/19 1345 05/25/19 1511 05/25/19 1515 05/25/19 1530  BP: (!) 176/61 (!) 155/61 (!) 161/50 (!) 162/56  Pulse: 76  75 63  Resp: 20 17 (!) 24 (!) 21  SpO2: 100%  99% 100%  Weight:       Eyes: Right eyes pupil is abnormal.  Left eye pupil within normal limits. ENMT: Mucous membranes are dry. Posterior pharynx clear of any exudate or lesions.  Neck: normal, supple, no masses, no thyromegaly Respiratory: clear to auscultation bilaterally, no wheezing, no crackles. Normal respiratory effort. No accessory muscle use.  Cardiovascular: Regular rate and rhythm, no murmurs / rubs / gallops. No extremity edema. 2+ pedal pulses. No carotid bruits.  Abdomen: no tenderness, no masses palpated. No hepatosplenomegaly. Bowel sounds positive.  Musculoskeletal: no clubbing / cyanosis.  Significant crepitation noted on the left  knee with tenderness to palpation and decreased range of motion on flexion. Skin: no rashes, lesions, ulcers. No induration Neurologic: CN 2-12 grossly intact. Sensation intact, DTR normal. Strength 5/5 on the right upper and lower extremity.  Strength 4+ /5 on the left upper and lower extremity. Psychiatric: faiejudgment and insight. Alert and oriented x 3. Normal mood.     Labs on Admission: I have personally reviewed following labs and imaging studies  CBC: Recent Labs  Lab 05/25/19 1310 05/25/19 1313  WBC 4.7  --   NEUTROABS 3.0  --   HGB 10.1* 11.2*  HCT 32.1* 33.0*  MCV 79.9*  --   PLT 275  --    Basic Metabolic Panel: Recent Labs  Lab 05/25/19 1310 05/25/19 1313  NA 137 138  K 4.3 4.3  CL 105 106  CO2 23  --   GLUCOSE 169* 161*  BUN 31* 30*  CREATININE 1.67* 1.60*  CALCIUM 9.3  --    GFR: CrCl cannot be calculated (Unknown ideal weight.). Liver Function Tests: Recent Labs  Lab  05/25/19 1310  AST 31  ALT 25  ALKPHOS 76  BILITOT 0.8  PROT 6.9  ALBUMIN 4.2   No results for input(s): LIPASE, AMYLASE in the last 168 hours. No results for input(s): AMMONIA in the last 168 hours. Coagulation Profile: Recent Labs  Lab 05/25/19 1310  INR 1.1   Cardiac Enzymes: No results for input(s): CKTOTAL, CKMB, CKMBINDEX, TROPONINI in the last 168 hours. BNP (last 3 results) No results for input(s): PROBNP in the last 8760 hours. HbA1C: No results for input(s): HGBA1C in the last 72 hours. CBG: Recent Labs  Lab 05/25/19 1303  GLUCAP 163*   Lipid Profile: No results for input(s): CHOL, HDL, LDLCALC, TRIG, CHOLHDL, LDLDIRECT in the last 72 hours. Thyroid Function Tests: No results for input(s): TSH, T4TOTAL, FREET4, T3FREE, THYROIDAB in the last 72 hours. Anemia Panel: No results for input(s): VITAMINB12, FOLATE, FERRITIN, TIBC, IRON, RETICCTPCT in the last 72 hours. Urine analysis:  No results found for: COLORURINE, APPEARANCEUR, LABSPEC, PHURINE, GLUCOSEU, HGBUR, BILIRUBINUR, KETONESUR, PROTEINUR, UROBILINOGEN, NITRITE, LEUKOCYTESUR Sepsis Labs: No results found for this or any previous visit (from the past 240 hour(s)).   Radiological Exams on Admission: Ct Angio Head W Or Wo Contrast  Result Date: 05/25/2019 CLINICAL DATA:  63 year old male with left side facial droop and numbness, last seen normal 1000 hours. EXAM: CT ANGIOGRAPHY HEAD AND NECK TECHNIQUE: Multidetector CT imaging of the head and neck was performed using the standard protocol during bolus administration of intravenous contrast. Multiplanar CT image reconstructions and MIPs were obtained to evaluate the vascular anatomy. Carotid stenosis measurements (when applicable) are obtained utilizing NASCET criteria, using the distal internal carotid diameter as the denominator. CONTRAST:  OMNIPAQUE IOHEXOL 350 MG/ML SOLN COMPARISON:  Plain head CT 1313 hours today. FINDINGS: CTA NECK Skeleton: No acute  osseous abnormality identified. Upper chest: Negative. Other neck: Small right thyroid lobe nodule does not meet size criteria for ultrasound follow-up. Otherwise negative. Aortic arch: 3 vessel arch configuration with no arch atherosclerosis. Right carotid system: Negative brachiocephalic artery and right CCA origin. Soft plaque in the medial vessel at the level of the larynx on series 508, image 128 without significant stenosis. Negative right carotid bifurcation and cervical right ICA. Left carotid system: Negative left CCA origin. Soft plaque in the medial vessel at the level of the larynx without significant stenosis. Mild soft and calcified plaque at the lateral left ICA origin and the bulb. No stenosis to the skull base. Vertebral arteries: Negative proximal right subclavian artery and right vertebral artery origin. Negative right vertebral artery to the skull base. Negative proximal left subclavian artery and left vertebral artery origin. Patent left vertebral artery to the skull base without stenosis. CTA HEAD Posterior circulation: Codominant distal vertebral arteries without stenosis. Normal right PICA origin. The left AICA appears dominant. Patent vertebrobasilar junction. Diminutive basilar artery is patent without focal stenosis. Patent SCA origins. Fetal type PCA origins, more so the left. Bilateral PCA branches are mildly irregular, with mild stenosis of the right P2 segment on series 513, image 20. Anterior circulation: Both ICA siphons are patent. On the left there is mild plaque without stenosis. Normal left ophthalmic and posterior communicating artery origins. On the right there is mostly cavernous segment calcified plaque with only mild stenosis. Normal right posterior communicating artery origin. Patent carotid termini. Bilateral MCA and ACA origins are within normal limits. Diminutive or absent anterior communicating artery. Bilateral ACA branches are within normal limits. Left MCA M1 segment  and bifurcation are patent without stenosis. Left MCA branches are within normal limits. Right MCA M1 segment and trifurcation are patent without stenosis. Right MCA branches are within normal limits. Venous sinuses: Patent. Anatomic variants: Fetal type bilateral PCA origins and diminutive basilar artery. Review of the MIP images confirms the above findings IMPRESSION: Negative for large vessel occlusion. Mild for age atherosclerosis in the head and neck, most notably soft plaque in both CCAs, with no hemodynamically significant stenosis. Electronically Signed   By: Odessa Fleming M.D.   On: 05/25/2019 14:18   Ct Angio Neck W Or Wo Contrast  Result Date: 05/25/2019 CLINICAL DATA:  63 year old male with left side facial droop and numbness, last seen normal 1000 hours. EXAM: CT ANGIOGRAPHY HEAD AND NECK TECHNIQUE: Multidetector CT imaging of the head and neck was performed using the standard protocol during bolus administration of intravenous contrast. Multiplanar CT image reconstructions and MIPs were obtained to evaluate the vascular anatomy. Carotid  stenosis measurements (when applicable) are obtained utilizing NASCET criteria, using the distal internal carotid diameter as the denominator. CONTRAST:  OMNIPAQUE IOHEXOL 350 MG/ML SOLN COMPARISON:  Plain head CT 1313 hours today. FINDINGS: CTA NECK Skeleton: No acute osseous abnormality identified. Upper chest: Negative. Other neck: Small right thyroid lobe nodule does not meet size criteria for ultrasound follow-up. Otherwise negative. Aortic arch: 3 vessel arch configuration with no arch atherosclerosis. Right carotid system: Negative brachiocephalic artery and right CCA origin. Soft plaque in the medial vessel at the level of the larynx on series 508, image 128 without significant stenosis. Negative right carotid bifurcation and cervical right ICA. Left carotid system: Negative left CCA origin. Soft plaque in the medial vessel at the level of the larynx without  significant stenosis. Mild soft and calcified plaque at the lateral left ICA origin and the bulb. No stenosis to the skull base. Vertebral arteries: Negative proximal right subclavian artery and right vertebral artery origin. Negative right vertebral artery to the skull base. Negative proximal left subclavian artery and left vertebral artery origin. Patent left vertebral artery to the skull base without stenosis. CTA HEAD Posterior circulation: Codominant distal vertebral arteries without stenosis. Normal right PICA origin. The left AICA appears dominant. Patent vertebrobasilar junction. Diminutive basilar artery is patent without focal stenosis. Patent SCA origins. Fetal type PCA origins, more so the left. Bilateral PCA branches are mildly irregular, with mild stenosis of the right P2 segment on series 513, image 20. Anterior circulation: Both ICA siphons are patent. On the left there is mild plaque without stenosis. Normal left ophthalmic and posterior communicating artery origins. On the right there is mostly cavernous segment calcified plaque with only mild stenosis. Normal right posterior communicating artery origin. Patent carotid termini. Bilateral MCA and ACA origins are within normal limits. Diminutive or absent anterior communicating artery. Bilateral ACA branches are within normal limits. Left MCA M1 segment and bifurcation are patent without stenosis. Left MCA branches are within normal limits. Right MCA M1 segment and trifurcation are patent without stenosis. Right MCA branches are within normal limits. Venous sinuses: Patent. Anatomic variants: Fetal type bilateral PCA origins and diminutive basilar artery. Review of the MIP images confirms the above findings IMPRESSION: Negative for large vessel occlusion. Mild for age atherosclerosis in the head and neck, most notably soft plaque in both CCAs, with no hemodynamically significant stenosis. Electronically Signed   By: Odessa Fleming M.D.   On: 05/25/2019  14:18   Mr Brain Wo Contrast  Result Date: 05/25/2019 CLINICAL DATA:  Stroke.  Left facial droop and numbness. EXAM: MRI HEAD WITHOUT CONTRAST TECHNIQUE: Multiplanar, multiecho pulse sequences of the brain and surrounding structures were obtained without intravenous contrast. COMPARISON:  CT head 05/25/2019 FINDINGS: Brain: Artifact over the face and frontal lobe due to dental hardware Negative for acute infarct. Ventricle size and cerebral volume normal for age. Numerous hyperintensities in the deep white matter bilaterally. Brainstem and cerebellum normal. Negative for hemorrhage or mass. Vascular: Normal arterial flow voids Skull and upper cervical spine: Negative Sinuses/Orbits: Negative Other: None IMPRESSION: Negative for acute infarct Moderate chronic microvascular ischemic changes in the white matter. Electronically Signed   By: Marlan Palau M.D.   On: 05/25/2019 15:16   Ct Angio Chest/abd/pel For Dissection W And/or W/wo  Result Date: 05/25/2019 CLINICAL DATA:  Chest and back pain with left-sided numbness. Aortic dissection suspected. EXAM: CT ANGIOGRAPHY CHEST, ABDOMEN AND PELVIS TECHNIQUE: Multidetector CT imaging through the chest, abdomen and pelvis was performed using  the standard protocol during bolus administration of intravenous contrast. Multiplanar reconstructed images and MIPs were obtained and reviewed to evaluate the vascular anatomy. CONTRAST:  125mL OMNIPAQUE IOHEXOL 350 MG/ML SOLN COMPARISON:  None. FINDINGS: CTA CHEST FINDINGS Cardiovascular: Pre contrast images demonstrate faint coronary artery calcifications. There are no displaced intimal calcifications within the aorta. Following contrast, the aorta enhances normally. There is no evidence of dissection or aneurysm. The pulmonary arteries are well opacified with contrast. No evidence of acute pulmonary embolism. The heart size is normal. There is no pericardial effusion. Mediastinum/Nodes: There are no enlarged mediastinal, hilar  or axillary lymph nodes. There is mild heterogeneity of the right thyroid lobe without focally suspicious lesion. Trachea appears normal. There is a small hiatal hernia. Lungs/Pleura: There is no pleural effusion or pneumothorax. The lungs are clear. Musculoskeletal: No chest wall mass or suspicious osseous findings. Review of the MIP images confirms the above findings. CTA ABDOMEN AND PELVIS FINDINGS VASCULAR Aorta: Normal.  No evidence of aneurysm or dissection. Celiac: Normal with conventional branching pattern. SMA: Normal. Renals: Normal IMA: Normal Inflow: Normal. Veins: Suboptimally opacified.  No demonstrated abnormality. Review of the MIP images confirms the above findings. NON-VASCULAR Hepatobiliary: Possible mild hepatic steatosis. No focal lesion or abnormal enhancement identified. The gallbladder and biliary system appear unremarkable. Pancreas: Unremarkable. No evidence of pancreatic mass, ductal dilatation or surrounding inflammation. Spleen: Normal in size without focal abnormality. Adrenals/Urinary Tract: Both adrenal glands appear normal. The pre contrast images demonstrate contrast material within the renal collecting systems attributed to neck CTA performed earlier. There are tiny bilateral renal cysts. No evidence of hydronephrosis. The bladder appears unremarkable. Stomach/Bowel: No bowel wall thickening, distention or surrounding inflammation. The appendix appears normal. Lymphatic: No enlarged abdominopelvic lymph nodes. Reproductive: The prostate gland and seminal vesicles appear normal. Other: No ascites or free air. Musculoskeletal: No acute osseous findings or abdominal wall masses. Review of the MIP images confirms the above findings. IMPRESSION: 1. No acute vascular findings in the chest, abdomen or pelvis. No evidence of aortic dissection. Mild coronary artery atherosclerosis. 2. No acute findings in the chest, abdomen or pelvis. 3. Tiny bilateral renal cysts. Electronically Signed    By: Carey BullocksWilliam  Veazey M.D.   On: 05/25/2019 14:17   Ct Head Code Stroke Wo Contrast  Result Date: 05/25/2019 CLINICAL DATA:  Code stroke. 63 year old male with left-side facial droop and numbness last seen normal 1000 hours. EXAM: CT HEAD WITHOUT CONTRAST TECHNIQUE: Contiguous axial images were obtained from the base of the skull through the vertex without intravenous contrast. [facility]. FINDINGS: Brain: Patchy bilateral cerebral white matter hypodensity appears stable. No midline shift, mass effect, or evidence of intracranial mass lesion. No acute intracranial hemorrhage identified. No cortically based acute infarct identified. No ventriculomegaly. Vascular: Calcified atherosclerosis at the skull base. No suspicious intracranial vascular hyperdensity. Skull: Stable.  No acute osseous abnormality identified. Sinuses/Orbits: Visualized paranasal sinuses and mastoids are clear. Other: No acute orbit or scalp soft tissue findings. ASPECTS [facility]([place] Stroke Program Early CT Score) Total score (0-10 with 10 being normal): 10 IMPRESSION: 1. Stable non contrast CT appearance of the brain since May with nonspecific white matter disease. 2. ASPECTS 10. 3. These results were communicated to Dr. Wilford CornerArora at 1:17 pm on 05/25/2019 by text page via the [facility] messaging system. Electronically Signed   By: Odessa FlemingH  Hall M.D.   On: 05/25/2019 13:18    EKG: Independently reviewed.  Sinus rhythm  with RBBB and LPFB with ST elevation concerning for inferior injury.  Assessment/Plan Syncope: Acute.  Patient reports having loss of consciousness for unknown home amount of time.  Suspect patient had vasovagal syncope as it appears he is possibly dehydrated and in the setting of recent cocaine use. -Admit to a medical telemetry bed -Check orthostatic vital signs -IV fluids as seen below -Follow-up telemetry overnight  Chest pain: EKG was noted to be abnormal but  initial troponins negative x2.  He had been given full dose aspirin.  Suspect symptoms secondary to recent cocaine use. -Follow-up telemetry overnight -May warrant further imaging studies in a.m.  Left-sided weakness: Patient underwent full stroke work-up and had negative CT, CTA, and MRI of the brain.  Neurology evaluated and recommended no further stroke work-up -PT/OT to evaluate and treat in a.m.  Hypertensive urgency: Acute. Blood pressures initially elevated up to 176/61, but appear to be currently within normal limits. -Continue to monitor  -Hydralazine IV as needed  Acute kidney injury: Patient presents with creatinine elevated up to 1.67 with BUN 31.  Suspect prerenal cause given recent cocaine use. -Avoid nephrotoxic agents  -Check renal ultrasound -Follow-up CK given recent cocaine use -Bolus 2 L normal saline fluids then placed on a rate of 100 mL/h -Check BMP and CK in a.m  Left knee pain: Acute.  Patient with decreased range of motion of the left knee and tenderness to palpation.  Fell on the knee prior to coming into the hospital. -Check x-rays of the left knee -Voltaren gel as needed for pain  Diarrhea: Acute.  Suspect symptoms secondary to recent drug use. -Monitor intake and output -May warrant further work-up\  Polysubstance abuse, cocaine use: UDS positive for cocaine which patient admits to using in the last few days.  Given patient's initial history obtained unreliable.  Question how much alcohol he usually drinks. -Counseled on need of cessation of cocaine use -Prophylactically put on [place] protocol  Hyperglycemia: Initial blood glucose elevated at 169.  Patient reports history of being prediabetic.  No hemoglobin A1c on file. -Check hemoglobin A1c in a.m.  BPH -Continue Flomax  History of glaucoma: Patient reports decreased vision in the right eye and related to his glaucoma. -Continue lantanoprost  DVT prophylaxis: lovenox Code Status: full  Family  Communication: No family present at bedside Disposition Plan: Likely discharge home in 1 to 2 days Consults called: Neurology Admission status: Observation  Clydie Braun MD [facility] Pager [phone]   If 7PM-7AM, please contact night-coverage [email or web address] Password [email or web address]  05/25/2019, 4:06 PM

## 2019-05-25 NOTE — ED Notes (Signed)
Timothy Donovan ([facility]/Activate Code Stroke called @ 1301-per RN) called by Levada Dy

## 2019-05-25 NOTE — Consult Note (Addendum)
Neurology Consultation  Reason for Consult: Code stroke Referring Physician: Sedonia Small  History is obtained from: EMS  HPI: Timothy Donovan is a 63 y.o. male with unknown past medical history.  Patient apparently was walking when he suddenly felt left-sided weakness and pain/numbness.  Patient did fall.  A bystander at that point did call EMS.  Patient was brought to the hospital and arriving at the hospital called a code stroke.  Patient was immediately brought to CT scanner.  Patient still complaining of pain on the left leg and bilateral arms.  Patient denies smoking, illicit drugs, or previous stroke.   He is also complaining of chest pain.   ED course-CT head/CTA abdomen, chest, neck, head   Chart review-patient not been in the hospital system that I can see on epic  LKW: 10 AM tpa given?: no, no significant localizing lateralizing symptoms Premorbid modified Rankin scale (mRS): 0 NIH stroke score-6  ROS: A 14 point ROS was performed and is negative except as noted in the HPI.    past medical history: Hypertension Glaucoma Diabetes without complications   Family History  Problem Relation Age of Onset  . Hypertension Mother   . Hypertension Father     Social History:   reports that he has never smoked. He does not have any smokeless tobacco history on file. He reports current alcohol use. No history on file for drug.  Medications  Current Facility-Administered Medications:  .  sodium chloride flush (NS) 0.9 % injection 3 mL, 3 mL, Intravenous, Once, Lacretia Leigh, MD No current outpatient medications on file.   Exam: Current vital signs: Wt 92.7 kg  Vital signs in last 24 hours: Weight:  [92.7 kg] 92.7 kg (09/09 1341)  Physical Exam  Constitutional: Appears well-developed and well-nourished.  Psych: Affect appropriate to situation Eyes: No scleral injection HENT: No OP obstrucion Head: Normocephalic.  Cardiovascular: Normal rate and regular rhythm.  Respiratory:  Effort normal, non-labored breathing GI: Soft.  No distension. There is no tenderness.  Skin: WDI  Neuro: Mental Status: Patient is awake, alert, oriented to person, place, month, year, and situation. Patient is able to give a clear and coherent history.  Patient showed stuttering speech at times No signs of aphasia or neglect Cranial Nerves: II: Visual Fields are full.  III,IV, VI: EOMI without ptosis or diploplia. Pupils equal, round and reactive to light V: Facial sensation decreased on the left side splitting midline to light touch and vibration VII: Facial movement is symmetric.  VIII: hearing is intact to voice X: Palat elevates symmetrically XI: Shoulder shrug is symmetric. XII: tongue is midline without atrophy or fasciculations.  Motor: Tone is normal.  Patient able to lift bilateral legs approximately 4 inches off the bed with no drift, patient able to lift bilateral arms off the table approximately 3 inches but secondary to pain says he cannot do further.  At one point patient was distracted and was able to lift his left arm as if he was going to itch his nose. Sensory: Stated to be decreased on left face, left leg left arm. Deep Tendon Reflexes: 2+ and symmetric in the biceps and patellae.  Plantars: Toes are downgoing bilaterally.  Cerebellar: FNF showed no dysmetria and when hand was held over his head it was very volitional and would not let his finger fall to touch his nose.  Was not able to take part in heel-to-shin secondary to pain  Labs I have reviewed labs in epic and the results pertinent  to this consultation are:   CBC    Component Value Date/Time   WBC 4.7 05/25/2019 1310   RBC 4.02 (L) 05/25/2019 1310   HGB 11.2 (L) 05/25/2019 1313   HCT 33.0 (L) 05/25/2019 1313   PLT 275 05/25/2019 1310   MCV 79.9 (L) 05/25/2019 1310   MCH 25.1 (L) 05/25/2019 1310   MCHC 31.5 05/25/2019 1310   RDW 13.8 05/25/2019 1310   LYMPHSABS 0.9 05/25/2019 1310   MONOABS  0.8 05/25/2019 1310   EOSABS 0.1 05/25/2019 1310   BASOSABS 0.0 05/25/2019 1310    CMP     Component Value Date/Time   NA 138 05/25/2019 1313   K 4.3 05/25/2019 1313   CL 106 05/25/2019 1313   CO2 23 05/25/2019 1310   GLUCOSE 161 (H) 05/25/2019 1313   BUN 30 (H) 05/25/2019 1313   CREATININE 1.60 (H) 05/25/2019 1313   CALCIUM 9.3 05/25/2019 1310   PROT 6.9 05/25/2019 1310   ALBUMIN 4.2 05/25/2019 1310   AST 31 05/25/2019 1310   ALT 25 05/25/2019 1310   ALKPHOS 76 05/25/2019 1310   BILITOT 0.8 05/25/2019 1310   GFRNONAA 43 (L) 05/25/2019 1310   GFRAA 50 (L) 05/25/2019 1310    Imaging I have reviewed the images obtained:  CT-scan of the brain-bilateral cerebral white matter hypodensity appears to be stable.  No midline shift or mass-effect.  CTA of head and neck- negative for LVO.  Mild atherosclerosis in head and neck most notably soft plaque in both CCAs with no hemodynamically significant stenosis.  Felicie Mornavid Smith PA-C [facility] [phone]  M-F  (9:00 am- 5:00 PM)  05/25/2019, 1:38 PM    Attending addendum Seen and examined Reviewed the history and physical above which I independently performed. Specific functional components include stuttering speech on asking to repeat sentences but otherwise clear speech, distractible left-sided weakness and limitation by pain.  Labs reveal cocaine positive.  Although patient vehemently denied using any drugs.  Assessment:  63 year old male presented to the hospital with symptoms that started at 10 AM.  Symptoms consist of left-sided weakness and decreased sensation.  On exam there were a lot of functional components along with pain which compromised exam.  However there was no significant localizing lateralizing symptoms other than decreased sensation on the left.  Patient was immediately brought to MRI to further evaluate for possible stroke.  At this time differential diagnosis includes stroke with embellish symptoms  versus nonorganic symptoms etiology.  It is my suspicion that the symptoms are primarily related to his drug abuse and hypertension   Recommendations: -Stat MRI brain-if negative for stroke, no further neurological work-up at this time.  -- Milon DikesAshish Oaklee Esther, MD [facility] Pager: [phone] If 7pm to 7am, please call on call as listed on AMION.

## 2019-05-25 NOTE — ED Notes (Signed)
Pt denies being claustrophobic  

## 2019-05-25 NOTE — ED Provider Notes (Signed)
Medical screening examination/treatment/procedure(s) were conducted as a shared visit with non-physician practitioner(s) and myself.  I personally evaluated the patient during the encounter.  EKG Interpretation  Date/Time:  Wednesday May 25 2019 12:48:20 EDT Ventricular Rate:  70 PR Interval:  120 QRS Duration: 118 QT Interval:  420 QTC Calculation: 453 R Axis:   120 Text Interpretation:  Normal sinus rhythm Right bundle branch block Abnormal ECG No old tracing to compare Confirmed by Lacretia Leigh [phone]) on 05/25/2019 1:6:87 PM  63 year old male who presents with sudden onset of left-sided chest pain along with left-sided paresthesias.  States that he is not have a headache.  Notes weakness to his left side as well.  On exam patient has slightly decreased strength in his left upper and left lower extremity.  No real facial droop.  Code stroke initiated.  Patient seen by neurology.  Foot there may be a functional component to this.  Dissection study negative.  Patient will likely be admitted pending the results of his studies   Lacretia Leigh, MD 05/25/19 [phone]

## 2019-05-25 NOTE — ED Notes (Signed)
Pt's CBG result was 163. Informed Lynnze - RN.

## 2019-05-25 NOTE — ED Notes (Signed)
Patient is resting comfortably. 

## 2019-05-25 NOTE — ED Notes (Signed)
Per nurse first - Pt last seen normal was at 1000 today. Pt has flattened left facial fold and left sided numbness. PT seems generally weak. PT is blind in the right eye.   BP in left arm - 172/75 (102) BP right arm - 195/107 (130)   CBG 163

## 2019-05-25 NOTE — ED Notes (Signed)
The pt reports that he has back pain

## 2019-05-26 DIAGNOSIS — M25562 Pain in left knee: Secondary | ICD-10-CM

## 2019-05-26 DIAGNOSIS — F1410 Cocaine abuse, uncomplicated: Secondary | ICD-10-CM

## 2019-05-26 DIAGNOSIS — N179 Acute kidney failure, unspecified: Secondary | ICD-10-CM

## 2019-05-26 LAB — BASIC METABOLIC PANEL
Anion gap: 9 (ref 5–15)
BUN: 18 mg/dL (ref 8–23)
CO2: 21 mmol/L — ABNORMAL LOW (ref 22–32)
Calcium: 8.5 mg/dL — ABNORMAL LOW (ref 8.9–10.3)
Chloride: 110 mmol/L (ref 98–111)
Creatinine, Ser: 1.35 mg/dL — ABNORMAL HIGH (ref 0.61–1.24)
GFR calc Af Amer: >60 mL/min (ref >60)
GFR calc non Af Amer: 55 mL/min — ABNORMAL LOW (ref >60)
Glucose, Bld: 117 mg/dL — ABNORMAL HIGH (ref 70–99)
Potassium: 4.7 mmol/L (ref 3.5–5.1)
Sodium: 140 mmol/L (ref 135–145)

## 2019-05-26 LAB — CBC
HCT: 28.4 % — ABNORMAL LOW (ref 39.0–52.0)
Hemoglobin: 8.8 g/dL — ABNORMAL LOW (ref 13.0–17.0)
MCH: 25 pg — ABNORMAL LOW (ref 26.0–34.0)
MCHC: 31 g/dL (ref 30.0–36.0)
MCV: 80.7 fL (ref 80.0–100.0)
Platelets: 215 10*3/uL (ref 150–400)
RBC: 3.52 MIL/uL — ABNORMAL LOW (ref 4.22–5.81)
RDW: 14 % (ref 11.5–15.5)
WBC: 3.6 10*3/uL — ABNORMAL LOW (ref 4.0–10.5)
nRBC: 0.5 % — ABNORMAL HIGH (ref 0.0–0.2)

## 2019-05-26 LAB — HIV ANTIBODY (ROUTINE TESTING W REFLEX): HIV Screen 4th Generation wRfx: NONREACTIVE

## 2019-05-26 LAB — CK: Total CK: 266 U/L (ref 49–397)

## 2019-05-26 MED ORDER — DICLOFENAC SODIUM 1 % TD GEL: 2.0000 g | Freq: Four times a day (QID) | TRANSDERMAL | 0 refills | Status: AC

## 2019-05-26 NOTE — Progress Notes (Signed)
05/26/19 1406  PT Visit Information  Last PT Received On 05/26/19  Assistance Needed +1  History of Present Illness Timothy Donovan is a 63 y.o. male with medical history significant of hypertension, glaucoma of the right eye, and prediabetes; who presents with complaints of chest pain and fall on L knee. Imaging negative for acute abnormality in L knee. MRI negative for acute abnormality.   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  Communication  Communication No difficulties  Pain Assessment  Pain Assessment Faces  Faces Pain Scale 6  Pain Location L knee with movement  Pain Descriptors / Indicators Aching;Sore  Pain Intervention(s) Limited activity within patient's tolerance;Monitored during session;Repositioned  Cognition  Arousal/Alertness Awake/alert  Behavior During Therapy WFL for tasks assessed/performed  Overall Cognitive Status Within Functional Limits for tasks assessed  General Comments reports feeling down/depressed due to recent separation with wife  Upper Extremity Assessment  Upper Extremity Assessment Defer to OT evaluation  Lower Extremity Assessment  Lower Extremity Assessment Generalized weakness;LLE deficits/detail  LLE Deficits / Details Limited ROM in L knee secondary to L knee pain.   Cervical / Trunk Assessment  Cervical / Trunk Assessment Normal  Bed Mobility  General bed mobility comments In chair upon entry  Transfers  Overall  transfer level Needs assistance  Equipment used Rolling walker (2 wheeled)  Transfers Sit to/from Stand  Sit to Stand Min assist  General transfer comment Min A for power up into standing. Demonstrated safe hand placement.   Ambulation/Gait  Ambulation/Gait assistance Min guard  Gait Distance (Feet) 120 Feet  Assistive device Rolling walker (2 wheeled)  Gait Pattern/deviations Step-to pattern;Decreased step length - right;Decreased step length - left;Decreased weight shift to left;Antalgic  General Gait Details Slow, antalgic gait. Limited weightshift to LLE secondary to increased pain in L knee. Pt walking on toes to offweight. Cues for sequencing using RW.   Gait velocity Decreased  Balance  Overall balance assessment Needs assistance  Sitting-balance support No upper extremity supported;Feet supported  Sitting balance-Leahy Scale Good  Standing balance support Bilateral upper extremity supported;No upper extremity supported;During functional activity  Standing balance-Leahy Scale Poor  Standing balance comment Reliant on BUE support   General Comments  General comments (skin integrity, edema, etc.) Pt's son present in room at end of session.   PT - End of Session  Equipment Utilized During Treatment Gait belt  Activity Tolerance Patient tolerated treatment well  Patient left in chair;with call bell/phone within reach;with chair alarm set  Nurse Communication Mobility status  PT Assessment  PT Recommendation/Assessment Patient needs continued PT services  PT Visit Diagnosis Difficulty in walking, not elsewhere classified (R26.2);Unsteadiness on feet (R26.81);Pain  Pain - Right/Left Left  Pain - part of body Knee  PT Problem List Decreased strength;Decreased range of motion;Decreased balance;Decreased activity tolerance;Decreased mobility;Decreased knowledge of use of DME;Decreased knowledge of precautions;Pain  PT Plan  PT Frequency (ACUTE ONLY) Min 3X/week  PT  Treatment/Interventions (ACUTE ONLY)  DME instruction;Gait training;Functional mobility training;Therapeutic activities;Therapeutic exercise;Balance training;Patient/family education  AM-PAC PT "6 Clicks" Mobility Outcome Measure (Version 2)  Help needed turning from your back to your side while in a flat bed without using bedrails? 3  Help needed moving from lying on your back to sitting on the side of a flat bed without using bedrails? 3  Help needed moving to and from a bed to a chair (including a wheelchair)? 3  Help needed standing up from a chair using your arms (e.g., wheelchair or bedside chair)? 3  Help needed to walk in hospital room? 3  Help needed climbing 3-5 steps with a railing?  3  6 Click Score 18  Consider Recommendation of Discharge To: Home with [facility]  PT Recommendation  Follow Up Recommendations Outpatient PT  PT equipment Rolling walker with 5" wheels  Individuals Consulted  Consulted and Agree with Results and Recommendations Patient  Acute Rehab PT Goals  Patient Stated Goal go home and get stronger  PT Goal Formulation With patient  Time For Goal Achievement 06/09/19  Potential to Achieve Goals Good  PT Time Calculation  PT Start Time (ACUTE ONLY) 1344  PT Stop Time (ACUTE ONLY) 1401  PT Time Calculation (min) (ACUTE ONLY) 17 min  PT General Charges  $$ ACUTE PT VISIT 1 Visit  PT Evaluation  $PT Eval Low Complexity 1 Low   Pt admitted secondary to problem above with deficits above. Pt requiring min to min guard A for mobility with RW. Educated about use of RW at home to increase safety with mobility. Pt reports he is separated from his wife and plans to d/c home with his son, but does not know if that will be his permanent residence. Recommend outpatient PT follow up to address current L knee deficits. Will continue to follow acutely to maximize functional mobility independence and safety.   Leighton Ruff, PT, DPT  Acute Rehabilitation Services  Pager:  [phone] Office: [phone]

## 2019-05-26 NOTE — Progress Notes (Signed)
Pt arrived from ED to [facility]. Pt A&O. CHG bath given and assessments complete. Pt c/o chest pain in left side and pain in right kniee from where he fell prior to admission. Pt VS stable. Pt given call bell and oriented to room. Handoff given to day shift RN. Lajoyce Corners, RN

## 2019-05-26 NOTE — Evaluation (Signed)
Occupational Therapy Evaluation Patient Details Name: Timothy Donovan MRN: [id number] DOB: 1955-12-25 Today's Date: 05/26/2019    History of Present Illness Timothy Donovan is a 63 y.o. male with medical history significant of hypertension, glaucoma of the right eye, and prediabetes; who presents with complaints of chest pain and fall on L knee   Clinical Impression   Pt admitted with above diagnoses, with baseline visual deficits and LLE pain limiting ability to engage in BADL at desired level of ind. 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 At time of eval he is min A for bed mobility and to power up with t/fs. He shares with OT a recent separation between him and his wife, causing him to stay with his son after this d/c (home info below reflects son's home). He is able to complete a toilet transfer at min guard and standing grooming at min guard as well. He completed functional mobility with RW, making more steady and stable. Given continuing balance compromises, recommended 3:1 for seated shower. At this time recommending OP OT for continued rehab in progressing BADL/IADL as well as visual compensatory strategies. Will continue to follow per POC listed below.    Follow Up Recommendations  Outpatient OT;Supervision - Intermittent    Equipment Recommendations  3 in 1 bedside commode    Recommendations for Other Services       Precautions / Restrictions Precautions Precautions: Fall Required Braces or Orthoses: Other Brace Other Brace: L knee sleeve Restrictions Weight Bearing Restrictions: No      Mobility Bed Mobility Overal bed mobility: Needs Assistance Bed Mobility: Supine to Sit     Supine to sit: Min assist     General bed mobility comments: min A for BLE translation to EOB and min A to sit trunk upright  Transfers Overall transfer level: Needs assistance Equipment used: Rolling walker (2  wheeled) Transfers: Sit to/from Stand Sit to Stand: Min assist         General transfer comment: min A to safely power up to standing, some dizziness in standing but VSS    Balance Overall balance assessment: Needs assistance Sitting-balance support: Bilateral upper extremity supported;No upper extremity supported;Feet supported Sitting balance-Leahy Scale: Good     Standing balance support: Bilateral upper extremity supported;No upper extremity supported;During functional activity Standing balance-Leahy Scale: Poor Standing balance comment: reliant on external support; when standing at sink brushing teeth, reliant on sink to stabilize with BUEs                           ADL either performed or assessed with clinical judgement   ADL Overall ADL's : Needs assistance/impaired Eating/Feeding: Set up;Sitting   Grooming: Supervision/safety;Standing;Oral care Grooming Details (indicate cue type and reason): at sink in room, leaning of sink to offload pain Upper Body Bathing: Set up;Sitting   Lower Body Bathing: Minimal assistance;Sit to/from stand;Sitting/lateral leans Lower Body Bathing Details (indicate cue type and reason): 2/2 LB pain Upper Body Dressing : Set up;Sitting   Lower Body Dressing: Minimal assistance;Sitting/lateral leans;Sit to/from stand;With adaptive equipment Lower Body Dressing Details (indicate cue type and reason): 2/2 LB pain Toilet Transfer: Min guard;Regular Toilet;RW   Toileting- Clothing Manipulation and Hygiene: Modified independent;Sitting/lateral lean   Tub/ Shower Transfer: Minimal assistance;3 in 1;Rolling walker   Functional mobility during ADLs: Min guard;Rolling walker General ADL Comments: pt overall min guard for functional mobility ltd 2/2 LLE  pain with LB ADLs and higher level balance deficits     Vision Baseline Vision/History: Glaucoma Patient Visual Report: No change from baseline Additional Comments: 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 Shares that this impacted driving and occasioanlly reading labels     Perception     Praxis      Pertinent Vitals/Pain Pain Assessment: Faces Faces Pain Scale: Hurts little more Pain Location: L knee with movement Pain Descriptors / Indicators: Aching;Sore Pain Intervention(s): Limited activity within patient's tolerance;Repositioned;RN gave pain meds during session;Monitored during session     Hand Dominance     Extremity/Trunk Assessment Upper Extremity Assessment Upper Extremity Assessment: Overall WFL for tasks assessed   Lower Extremity Assessment Lower Extremity Assessment: Defer to PT evaluation       Communication Communication Communication: No difficulties   Cognition Arousal/Alertness: Awake/alert Behavior During Therapy: WFL for tasks assessed/performed Overall Cognitive Status: Within Functional Limits for tasks assessed                                 General Comments: reports feeling down/depressed due to recent separation with wife   General Comments       Exercises     Shoulder Instructions      Home Living Family/patient expects to be discharged to:: Private residence Living Arrangements: Children(sons home) Available Help at Discharge: Family Type of Home: House Home Access: Level entry     Home Layout: One level     Bathroom Shower/Tub: Chief Strategy Officer: None          Prior Functioning/Environment Level of Independence: Independent        Comments: pt does not drive due to blindness from glaucoma in R eye        OT Problem List: Decreased strength;Decreased knowledge of use of DME or AE;Decreased activity tolerance;Impaired vision/perception;Impaired balance (sitting and/or standing);Pain      OT Treatment/Interventions: Self-care/ADL training;Therapeutic exercise;Patient/family education;Balance training;Energy conservation;Therapeutic activities;DME and/or AE  instruction    OT Goals(Current goals can be found in the care plan section) Acute Rehab OT Goals Patient Stated Goal: go home and get stronger OT Goal Formulation: With patient Time For Goal Achievement: 06/09/19 Potential to Achieve Goals: Good  OT Frequency: Min 2X/week   Barriers to D/C:            Co-evaluation              AM-PAC OT "6 Clicks" Daily Activity     Outcome Measure Help from another person eating meals?: None Help from another person taking care of personal grooming?: A Little Help from another person toileting, which includes using toliet, bedpan, or urinal?: A Little Help from another person bathing (including washing, rinsing, drying)?: A Little Help from another person to put on and taking off regular upper body clothing?: None Help from another person to put on and taking off regular lower body clothing?: A Little 6 Click Score: 20   End of Session Equipment Utilized During Treatment: Gait belt;Rolling walker;Other (comment)(L knee sleeve) Nurse Communication: Mobility status  Activity Tolerance:   Patient left: in chair;with call bell/phone within reach;with chair alarm set;with nursing/sitter in room  OT Visit Diagnosis: Unsteadiness on feet (R26.81);Other abnormalities of gait and mobility (R26.89);History of falling (Z91.81);Pain Pain - Right/Left: Left Pain - part of body: Knee;Leg  Time: [id number] OT Time Calculation (min): 22 min Charges:  OT General Charges $OT Visit: 1 Visit OT Evaluation $OT Eval Low Complexity: 1 Low  Dalphine HandingKaylee Kinberly Perris, MSOT, OTR/L Behavioral Health OT/ Acute Relief OT [facility] Office: [phone]  Dalphine HandingKaylee Jenny Lai 05/26/2019, 2:06 PM

## 2019-05-26 NOTE — TOC Transition Note (Signed)
Transition of Care [facility]) - CM/SW Discharge Note Marvetta Gibbons RN, BSN Transitions of Care Unit 4E- RN Case Manager [phone]   Patient Details  Name: Timothy Donovan MRN: [id number] Date of Birth: 04/16/1956  Transition of [facility]) CM/SW Contact:  Dawayne Patricia, RN Phone Number: 05/26/2019, 2:41 PM   Clinical Narrative:    Pt stable for transition home, received msg from PT/OT that pt would benefit from outpt referral for PT/OT and DME RW/3n1- notified MD for orders and spoke with pt and son at bedside, choice offered to pt for location that would be convenient - per pt [employer] location would be convenient for him. Referral sent to [address] location for PT/OT via epic. Call made to [facility] with [employer] for RW and 3n1- DME to be delivered to room prior to discharge.    Final next level of care: Home/Self Care Barriers to Discharge: No Barriers Identified   Patient Goals and CMS Choice Patient states their goals for this hospitalization and ongoing recovery are:: "to get things in order" CMS Medicare.gov Compare Post Acute Care list provided to:: Patient Choice offered to / list presented to : Patient  Discharge Placement             Home          Discharge Plan and Services In-house Referral: Clinical Social Work Discharge Planning Services: CM Consult Post Acute Care Choice: Durable Medical Equipment          DME Arranged: 3-N-1, Walker rolling DME Agency: [employer] Date DME Agency Contacted: 05/26/19 Time DME Agency Contacted: 61 Representative spoke with at DME Agency: Thedore Mins HH Arranged: NA          Social Determinants of Health ([place]) Interventions     Readmission Risk Interventions No flowsheet data found.

## 2019-05-26 NOTE — Progress Notes (Signed)
Received report from ED RN on pt coming to [facility]. Lajoyce Corners, RN

## 2019-05-26 NOTE — ED Notes (Signed)
Tele   Breakfast ordered  

## 2019-05-26 NOTE — Progress Notes (Signed)
Orthopedic Tech Progress Note Patient Details:  Timothy Donovan [birth year] [id number]  Ortho Devices Type of Ortho Device: Knee Sleeve Ortho Device/Splint Location: left Ortho Device/Splint Interventions: Application   Post Interventions Patient Tolerated: Well Instructions Provided: Care of device   [name] 05/26/2019, 10:52 AM

## 2019-05-26 NOTE — Discharge Summary (Signed)
Physician Discharge Summary  Timothy RadBobby J Hofland ZOX:[id number] DOB: [birth year] DOA: 05/25/2019  PCP: Lavinia SharpsPlacey, Mary Ann, NP  Admit date: 05/25/2019 Discharge date: 05/26/2019  Admitted From: home Discharge disposition: home   Recommendations for Outpatient Follow-Up:   1. Outpatient PT 2. BMP 1 week 3. HgbA1c pending   Discharge Diagnosis:   Active Problems:   Syncope   Chest pain   Hyperglycemia   BPH (benign prostatic hyperplasia)   Left-sided weakness   AKI (acute kidney injury) (HCC)   Left-sided weakness    Discharge Condition: Improved.  Diet recommendation: Low sodium, heart healthy  Wound care: None.  Code status: Full.   History of Present Illness:   Timothy Donovan is a 63 y.o. male with medical history significant of hypertension, glaucoma of the right eye, and prediabetes; who presents with complaints of chest pain. History from the patient does not appear completely reliable and changed several times during the day.  Patient reports that he was walking down the street when he had acute onset of left-sided chest pain around 11 AM this morning.  States that he felt like someone was hitting him in the chest.  Thereafter, developed left-sided weakness and reported feeling lightheaded before falling to the ground.  Patient denies hitting his head but did sustain trauma to his left knee.  Reports a bystander got him into their car and drove him to the hospital.  This time he complains of right knee pain that he relates to the fall.  He admits to smoking cocaine approximately 3 days ago and makes him like this was his first time.  Other associated symptoms include complaints of abdominal discomfort and diarrhea.  Reports having 3-4 stools per day over the last 3 days   Hospital Course by Problem:   Syncope: Acute.  Patient reports having loss of consciousness for unknown home amount of time.  Suspect patient had vasovagal syncope as it appears he is possibly dehydrated  and in the setting of recent cocaine use. -improved with IVF  Chest pain: EKG was noted to be abnormal but initial troponins negative x2.  He had been given full dose aspirin.  Suspect symptoms secondary to recent cocaine use. -tele unrevealing -encouraged cocaine cessation  Left-sided weakness: Patient underwent full stroke work-up and had negative CT, CTA, and MRI of the brain.  Neurology evaluated and recommended no further stroke work-up -PT/OT- outpatient PT  Hypertensive urgency: Acute. Blood pressures initially elevated up to 176/61, but appear to be currently within normal limits. -low salt diet  Acute kidney injury: Patient presents with creatinine elevated up to 1.67 with BUN 31.  Suspect prerenal cause given recent cocaine use. -Avoid nephrotoxic agents  -renal U/S unremarkable -improved with IVF  Left knee pain: Acute.  Patient with decreased range of motion of the left knee and tenderness to palpation.  Fell on the knee prior to coming into the hospital. -negative x-rays of the left knee -Voltaren gel as needed for pain Knee sleeve  Diarrhea: Acute.  Suspect symptoms secondary to recent drug use. -resolved  Polysubstance abuse, cocaine use:  -encouraged cessation  Hyperglycemia: Initial blood glucose elevated at 169.  Patient reports history of -hemoglobin A1c pending  BPH -Continue Flomax  History of glaucoma: Patient reports decreased vision in the right eye and related to his glaucoma. -Continue lantanoprost    Medical Consultants:      Discharge Exam:   Vitals:   05/26/19 1000 05/26/19 1206  BP:  Marland Kitchen(!)  141/82  Pulse:  60  Resp:  14  Temp:  98.6 F (37 C)  SpO2: 100% 100%   Vitals:   05/26/19 0659 05/26/19 0705 05/26/19 1000 05/26/19 1206  BP:  133/60  (!) 141/82  Pulse:  (!) 52  60  Resp:  17  14  Temp:  97.8 F (36.6 C)  98.6 F (37 C)  TempSrc:  Oral  Oral  SpO2:  100% 100% 100%  Weight: 93.7 kg     Height: 6' (1.829 m)        General exam: Appears calm and comfortable.   The results of significant diagnostics from this hospitalization (including imaging, microbiology, ancillary and laboratory) are listed below for reference.     Procedures and Diagnostic Studies:   Ct Angio Head W Or Wo Contrast  Result Date: 05/25/2019 CLINICAL DATA:  63 year old male with left side facial droop and numbness, last seen normal 1000 hours. EXAM: CT ANGIOGRAPHY HEAD AND NECK TECHNIQUE: Multidetector CT imaging of the head and neck was performed using the standard protocol during bolus administration of intravenous contrast. Multiplanar CT image reconstructions and MIPs were obtained to evaluate the vascular anatomy. Carotid stenosis measurements (when applicable) are obtained utilizing NASCET criteria, using the distal internal carotid diameter as the denominator. CONTRAST:  125mL OMNIPAQUE IOHEXOL 350 MG/ML SOLN COMPARISON:  Plain head CT 1313 hours today. FINDINGS: CTA NECK Skeleton: No acute osseous abnormality identified. Upper chest: Negative. Other neck: Small right thyroid lobe nodule does not meet size criteria for ultrasound follow-up. Otherwise negative. Aortic arch: 3 vessel arch configuration with no arch atherosclerosis. Right carotid system: Negative brachiocephalic artery and right CCA origin. Soft plaque in the medial vessel at the level of the larynx on series 508, image 128 without significant stenosis. Negative right carotid bifurcation and cervical right ICA. Left carotid system: Negative left CCA origin. Soft plaque in the medial vessel at the level of the larynx without significant stenosis. Mild soft and calcified plaque at the lateral left ICA origin and the bulb. No stenosis to the skull base. Vertebral arteries: Negative proximal right subclavian artery and right vertebral artery origin. Negative right vertebral artery to the skull base. Negative proximal left subclavian artery and left vertebral artery origin.  Patent left vertebral artery to the skull base without stenosis. CTA HEAD Posterior circulation: Codominant distal vertebral arteries without stenosis. Normal right PICA origin. The left AICA appears dominant. Patent vertebrobasilar junction. Diminutive basilar artery is patent without focal stenosis. Patent SCA origins. Fetal type PCA origins, more so the left. Bilateral PCA branches are mildly irregular, with mild stenosis of the right P2 segment on series 513, image 20. Anterior circulation: Both ICA siphons are patent. On the left there is mild plaque without stenosis. Normal left ophthalmic and posterior communicating artery origins. On the right there is mostly cavernous segment calcified plaque with only mild stenosis. Normal right posterior communicating artery origin. Patent carotid termini. Bilateral MCA and ACA origins are within normal limits. Diminutive or absent anterior communicating artery. Bilateral ACA branches are within normal limits. Left MCA M1 segment and bifurcation are patent without stenosis. Left MCA branches are within normal limits. Right MCA M1 segment and trifurcation are patent without stenosis. Right MCA branches are within normal limits. Venous sinuses: Patent. Anatomic variants: Fetal type bilateral PCA origins and diminutive basilar artery. Review of the MIP images confirms the above findings IMPRESSION: Negative for large vessel occlusion. Mild for age atherosclerosis in the head and neck, most notably soft  plaque in both CCAs, with no hemodynamically significant stenosis. Electronically Signed   By: Genevie Ann M.D.   On: 05/25/2019 14:18   Dg Knee 1-2 Views Left  Result Date: 05/25/2019 CLINICAL DATA:  Fall on the left knee.  Pain. EXAM: LEFT KNEE - 1-2 VIEW COMPARISON:  None. FINDINGS: No evidence of fracture, dislocation, or joint effusion. Sharpening the tibial spines noted. Soft tissues are unremarkable. IMPRESSION: No fracture or dislocation. Mild degenerative changes.  Electronically Signed   By: Kerby Moors M.D.   On: 05/25/2019 19:13   Ct Angio Neck W Or Wo Contrast  Result Date: 05/25/2019 CLINICAL DATA:  63 year old male with left side facial droop and numbness, last seen normal 1000 hours. EXAM: CT ANGIOGRAPHY HEAD AND NECK TECHNIQUE: Multidetector CT imaging of the head and neck was performed using the standard protocol during bolus administration of intravenous contrast. Multiplanar CT image reconstructions and MIPs were obtained to evaluate the vascular anatomy. Carotid stenosis measurements (when applicable) are obtained utilizing NASCET criteria, using the distal internal carotid diameter as the denominator. CONTRAST:  148mL OMNIPAQUE IOHEXOL 350 MG/ML SOLN COMPARISON:  Plain head CT 1313 hours today. FINDINGS: CTA NECK Skeleton: No acute osseous abnormality identified. Upper chest: Negative. Other neck: Small right thyroid lobe nodule does not meet size criteria for ultrasound follow-up. Otherwise negative. Aortic arch: 3 vessel arch configuration with no arch atherosclerosis. Right carotid system: Negative brachiocephalic artery and right CCA origin. Soft plaque in the medial vessel at the level of the larynx on series 508, image 128 without significant stenosis. Negative right carotid bifurcation and cervical right ICA. Left carotid system: Negative left CCA origin. Soft plaque in the medial vessel at the level of the larynx without significant stenosis. Mild soft and calcified plaque at the lateral left ICA origin and the bulb. No stenosis to the skull base. Vertebral arteries: Negative proximal right subclavian artery and right vertebral artery origin. Negative right vertebral artery to the skull base. Negative proximal left subclavian artery and left vertebral artery origin. Patent left vertebral artery to the skull base without stenosis. CTA HEAD Posterior circulation: Codominant distal vertebral arteries without stenosis. Normal right PICA origin. The left  AICA appears dominant. Patent vertebrobasilar junction. Diminutive basilar artery is patent without focal stenosis. Patent SCA origins. Fetal type PCA origins, more so the left. Bilateral PCA branches are mildly irregular, with mild stenosis of the right P2 segment on series 513, image 20. Anterior circulation: Both ICA siphons are patent. On the left there is mild plaque without stenosis. Normal left ophthalmic and posterior communicating artery origins. On the right there is mostly cavernous segment calcified plaque with only mild stenosis. Normal right posterior communicating artery origin. Patent carotid termini. Bilateral MCA and ACA origins are within normal limits. Diminutive or absent anterior communicating artery. Bilateral ACA branches are within normal limits. Left MCA M1 segment and bifurcation are patent without stenosis. Left MCA branches are within normal limits. Right MCA M1 segment and trifurcation are patent without stenosis. Right MCA branches are within normal limits. Venous sinuses: Patent. Anatomic variants: Fetal type bilateral PCA origins and diminutive basilar artery. Review of the MIP images confirms the above findings IMPRESSION: Negative for large vessel occlusion. Mild for age atherosclerosis in the head and neck, most notably soft plaque in both CCAs, with no hemodynamically significant stenosis. Electronically Signed   By: Genevie Ann M.D.   On: 05/25/2019 14:18   Mr Brain Wo Contrast  Result Date: 05/25/2019 CLINICAL DATA:  Stroke.  Left facial droop and numbness. EXAM: MRI HEAD WITHOUT CONTRAST TECHNIQUE: Multiplanar, multiecho pulse sequences of the brain and surrounding structures were obtained without intravenous contrast. COMPARISON:  CT head 05/25/2019 FINDINGS: Brain: Artifact over the face and frontal lobe due to dental hardware Negative for acute infarct. Ventricle size and cerebral volume normal for age. Numerous hyperintensities in the deep white matter bilaterally.  Brainstem and cerebellum normal. Negative for hemorrhage or mass. Vascular: Normal arterial flow voids Skull and upper cervical spine: Negative Sinuses/Orbits: Negative Other: None IMPRESSION: Negative for acute infarct Moderate chronic microvascular ischemic changes in the white matter. Electronically Signed   By: Marlan Palau M.D.   On: 05/25/2019 15:16   US Renal  Result Date: 05/25/2019 CLINICAL DATA:  Acute kidney injury EXAM: RENAL / URINARY TRACT ULTRASOUND COMPLETE COMPARISON:  None. FINDINGS: Right Kidney: Renal measurements: 10.6 x 5.9 x 6.2 = volume: 204 mL . Echogenicity within normal limits. No mass or hydronephrosis visualized. Left Kidney: Renal measurements: 11.4 x 6.4 x 6.5 = volume: 245 mL. Echogenicity within normal limits. No mass or hydronephrosis visualized. Bladder: Appears normal for degree of bladder distention. IMPRESSION: Normal renal and bladder ultrasound. Electronically Signed   By: Jonna Clark M.D.   On: 05/25/2019 19:54   Ct Angio Chest/abd/pel For Dissection W And/or W/wo  Result Date: 05/25/2019 CLINICAL DATA:  Chest and back pain with left-sided numbness. Aortic dissection suspected. EXAM: CT ANGIOGRAPHY CHEST, ABDOMEN AND PELVIS TECHNIQUE: Multidetector CT imaging through the chest, abdomen and pelvis was performed using the standard protocol during bolus administration of intravenous contrast. Multiplanar reconstructed images and MIPs were obtained and reviewed to evaluate the vascular anatomy. CONTRAST:  OMNIPAQUE IOHEXOL 350 MG/ML SOLN COMPARISON:  None. FINDINGS: CTA CHEST FINDINGS Cardiovascular: Pre contrast images demonstrate faint coronary artery calcifications. There are no displaced intimal calcifications within the aorta. Following contrast, the aorta enhances normally. There is no evidence of dissection or aneurysm. The pulmonary arteries are well opacified with contrast. No evidence of acute pulmonary embolism. The heart size is normal. There is no  pericardial effusion. Mediastinum/Nodes: There are no enlarged mediastinal, hilar or axillary lymph nodes. There is mild heterogeneity of the right thyroid lobe without focally suspicious lesion. Trachea appears normal. There is a small hiatal hernia. Lungs/Pleura: There is no pleural effusion or pneumothorax. The lungs are clear. Musculoskeletal: No chest wall mass or suspicious osseous findings. Review of the MIP images confirms the above findings. CTA ABDOMEN AND PELVIS FINDINGS VASCULAR Aorta: Normal.  No evidence of aneurysm or dissection. Celiac: Normal with conventional branching pattern. SMA: Normal. Renals: Normal IMA: Normal Inflow: Normal. Veins: Suboptimally opacified.  No demonstrated abnormality. Review of the MIP images confirms the above findings. NON-VASCULAR Hepatobiliary: Possible mild hepatic steatosis. No focal lesion or abnormal enhancement identified. The gallbladder and biliary system appear unremarkable. Pancreas: Unremarkable. No evidence of pancreatic mass, ductal dilatation or surrounding inflammation. Spleen: Normal in size without focal abnormality. Adrenals/Urinary Tract: Both adrenal glands appear normal. The pre contrast images demonstrate contrast material within the renal collecting systems attributed to neck CTA performed earlier. There are tiny bilateral renal cysts. No evidence of hydronephrosis. The bladder appears unremarkable. Stomach/Bowel: No bowel wall thickening, distention or surrounding inflammation. The appendix appears normal. Lymphatic: No enlarged abdominopelvic lymph nodes. Reproductive: The prostate gland and seminal vesicles appear normal. Other: No ascites or free air. Musculoskeletal: No acute osseous findings or abdominal wall masses. Review of the MIP images confirms the above findings. IMPRESSION: 1. No acute vascular  findings in the chest, abdomen or pelvis. No evidence of aortic dissection. Mild coronary artery atherosclerosis. 2. No acute findings in the  chest, abdomen or pelvis. 3. Tiny bilateral renal cysts. Electronically Signed   By: Carey Bullocks M.D.   On: 05/25/2019 14:17   Ct Head Code Stroke Wo Contrast  Result Date: 05/25/2019 CLINICAL DATA:  Code stroke. 63 year old male with left-side facial droop and numbness last seen normal 1000 hours. EXAM: CT HEAD WITHOUT CONTRAST TECHNIQUE: Contiguous axial images were obtained from the base of the skull through the vertex without intravenous contrast. COMPARISON:  [facility] head CT 02/04/2019. FINDINGS: Brain: Patchy bilateral cerebral white matter hypodensity appears stable. No midline shift, mass effect, or evidence of intracranial mass lesion. No acute intracranial hemorrhage identified. No cortically based acute infarct identified. No ventriculomegaly. Vascular: Calcified atherosclerosis at the skull base. No suspicious intracranial vascular hyperdensity. Skull: Stable.  No acute osseous abnormality identified. Sinuses/Orbits: Visualized paranasal sinuses and mastoids are clear. Other: No acute orbit or scalp soft tissue findings. ASPECTS [facility] Stroke Program Early CT Score) Total score (0-10 with 10 being normal): 10 IMPRESSION: 1. Stable non contrast CT appearance of the brain since May with nonspecific white matter disease. 2. ASPECTS 10. 3. These results were communicated to Dr. Wilford Corner at 1:17 pm on 05/25/2019 by text page via the [facility] messaging system. Electronically Signed   By: Odessa Fleming M.D.   On: 05/25/2019 13:18     Labs:   Basic Metabolic Panel: Recent Labs  Lab 05/25/19 1310 05/25/19 1313 05/26/19 0847  NA 137 138 140  K 4.3 4.3 4.7  CL 105 106 110  CO2 23  --  21*  GLUCOSE 169* 161* 117*  BUN 31* 30* 18  CREATININE 1.67* 1.60* 1.35*  CALCIUM 9.3  --  8.5*   GFR Estimated Creatinine Clearance: 66.5 mL/min (A) (by C-G formula based on SCr of 1.35 mg/dL (H)). Liver Function Tests: Recent Labs  Lab 05/25/19 1310  AST 31  ALT 25   ALKPHOS 76  BILITOT 0.8  PROT 6.9  ALBUMIN 4.2   No results for input(s): LIPASE, AMYLASE in the last 168 hours. No results for input(s): AMMONIA in the last 168 hours. Coagulation profile Recent Labs  Lab 05/25/19 1310  INR 1.1    CBC: Recent Labs  Lab 05/25/19 1310 05/25/19 1313 05/26/19 0847  WBC 4.7  --  3.6*  NEUTROABS 3.0  --   --   HGB 10.1* 11.2* 8.8*  HCT 32.1* 33.0* 28.4*  MCV 79.9*  --  80.7  PLT 275  --  215   Cardiac Enzymes: Recent Labs  Lab 05/25/19 1706 05/26/19 0847  CKTOTAL 401* 266   BNP: Invalid input(s): POCBNP CBG: Recent Labs  Lab 05/25/19 1303  GLUCAP 163*   D-Dimer No results for input(s): DDIMER in the last 72 hours. Hgb A1c No results for input(s): HGBA1C in the last 72 hours. Lipid Profile No results for input(s): CHOL, HDL, LDLCALC, TRIG, CHOLHDL, LDLDIRECT in the last 72 hours. Thyroid function studies No results for input(s): TSH, T4TOTAL, T3FREE, THYROIDAB in the last 72 hours.  Invalid input(s): FREET3 Anemia work up No results for input(s): VITAMINB12, FOLATE, FERRITIN, TIBC, IRON, RETICCTPCT in the last 72 hours. Microbiology Recent Results (from the past 240 hour(s))  SARS Coronavirus 2 [facility] order, Performed in [facility] hospital lab) Nasopharyngeal Nasopharyngeal Swab     Status: None   Collection Time: 05/25/19  6:12  PM   Specimen: Nasopharyngeal Swab  Result Value Ref Range Status   SARS Coronavirus 2 NEGATIVE NEGATIVE Final    Comment: (NOTE) If result is NEGATIVE SARS-CoV-2 target nucleic acids are NOT DETECTED. The SARS-CoV-2 RNA is generally detectable in upper and lower  respiratory specimens during the acute phase of infection. The lowest  concentration of SARS-CoV-2 viral copies this assay can detect is 250  copies / mL. A negative result does not preclude SARS-CoV-2 infection  and should not be used as the sole basis for treatment or other  patient management decisions.  A negative result may  occur with  improper specimen collection / handling, submission of specimen other  than nasopharyngeal swab, presence of viral mutation(s) within the  areas targeted by this assay, and inadequate number of viral copies  (<250 copies / mL). A negative result must be combined with clinical  observations, patient history, and epidemiological information. If result is POSITIVE SARS-CoV-2 target nucleic acids are DETECTED. The SARS-CoV-2 RNA is generally detectable in upper and lower  respiratory specimens dur ing the acute phase of infection.  Positive  results are indicative of active infection with SARS-CoV-2.  Clinical  correlation with patient history and other diagnostic information is  necessary to determine patient infection status.  Positive results do  not rule out bacterial infection or co-infection with other viruses. If result is PRESUMPTIVE POSTIVE SARS-CoV-2 nucleic acids MAY BE PRESENT.   A presumptive positive result was obtained on the submitted specimen  and confirmed on repeat testing.  While 2019 novel coronavirus  (SARS-CoV-2) nucleic acids may be present in the submitted sample  additional confirmatory testing may be necessary for epidemiological  and / or clinical management purposes  to differentiate between  SARS-CoV-2 and other Sarbecovirus currently known to infect humans.  If clinically indicated additional testing with an alternate test  methodology [id number]) is advised. The SARS-CoV-2 RNA is generally  detectable in upper and lower respiratory sp ecimens during the acute  phase of infection. The expected result is Negative. Fact Sheet for Patients:  BoilerBrush.com.cy Fact Sheet for Healthcare Providers: https://pope.com/ This test is not yet approved or cleared by the [place] FDA and has been authorized for detection and/or diagnosis of SARS-CoV-2 by FDA under an Emergency Use Authorization (EUA).  This  EUA will remain in effect (meaning this test can be used) for the duration of the COVID-19 declaration under Section 564(b)(1) of the Act, 21 U.S.C. section 360bbb-3(b)(1), unless the authorization is terminated or revoked sooner. Performed at [facility], [address], [place], [place] [address]      Discharge Instructions:   Discharge Instructions    Diet - low sodium heart healthy   Complete by: As directed    Discharge instructions   Complete by: As directed    Outpatient follow up with CBC and BMP Cocaine cessation Avoid NSAIDs/ibuprofen Anemia work up with PCP   Increase activity slowly   Complete by: As directed      Allergies as of 05/26/2019   No Known Allergies     Medication List    STOP taking these medications   ibuprofen 800 MG tablet Commonly known as: ADVIL   naproxen 500 MG tablet Commonly known as: NAPROSYN     TAKE these medications   diclofenac sodium 1 % Gel Commonly known as: VOLTAREN Apply 2 g topically 4 (four) times daily.   isoniazid 300 MG tablet Commonly known as: NYDRAZID Take 300 mg by mouth  daily. Take with vitamin B6 (Pyridoxine)   latanoprost 0.005 % ophthalmic solution Commonly known as: XALATAN Place 1 drop into both eyes at bedtime.   pyridoxine 100 MG tablet Commonly known as: B-6 Take 100 mg by mouth daily.   tamsulosin 0.4 MG Caps capsule Commonly known as: FLOMAX Take 0.4 mg by mouth at bedtime.         Time coordinating discharge: 25 min  Signed:  Joseph Art DO  [facility] 05/26/2019, 12:58 PM
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
## 2019-05-27 LAB — HEMOGLOBIN A1C
Hgb A1c MFr Bld: 5.8 % — ABNORMAL HIGH (ref 4.8–5.6)
Mean Plasma Glucose: 120 mg/dL

## 2019-08-05 ENCOUNTER — Other Ambulatory Visit: Payer: Self-pay

## 2019-08-05 DIAGNOSIS — Z20822 Contact with and (suspected) exposure to covid-19: Secondary | ICD-10-CM

## 2019-08-08 LAB — NOVEL CORONAVIRUS, NAA: SARS-CoV-2, NAA: NOT DETECTED

## 2019-10-04 ENCOUNTER — Ambulatory Visit: Payer: Medicaid Other | Attending: Internal Medicine

## 2019-10-24 ENCOUNTER — Ambulatory Visit: Payer: Medicaid Other | Attending: Internal Medicine

## 2020-11-09 IMAGING — MR MR HEAD W/O CM
9 of 10 series · 37 of 48 positions shown · non-contrast
Comparison: CT head 05/25/2019

CLINICAL DATA: Stroke.  Left facial droop and numbness.

EXAM:
MRI HEAD WITHOUT CONTRAST
TECHNIQUE: Multiplanar, multiecho pulse sequences of the brain and surrounding
structures were obtained without intravenous contrast.

[Series 4: DWI · axial · 3.0mm · 1.09mm/px · z∈[-63,+86]mm · 8 of 102 slices shown (1 of 4)]
[im 1/102]
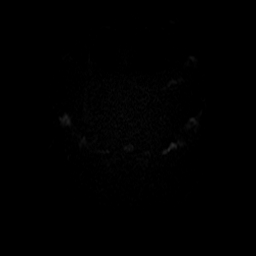
[im 12/102]
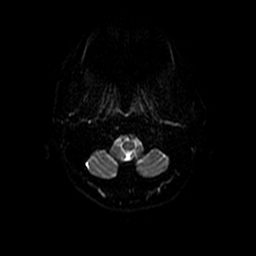
[im 34/102]
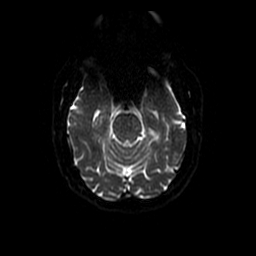
[im 45/102]
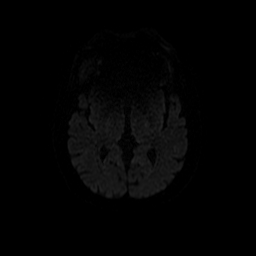
[im 57/102]
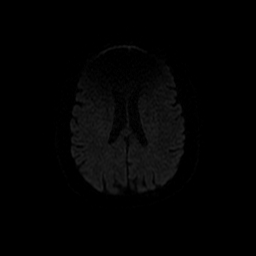
[im 68/102]
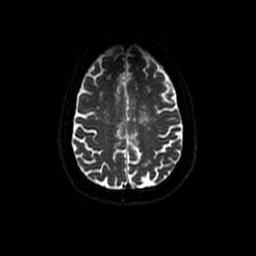
[im 90/102]
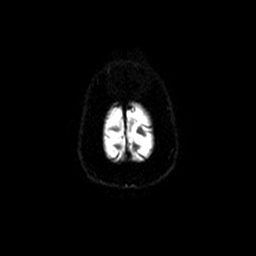
[im 102/102]
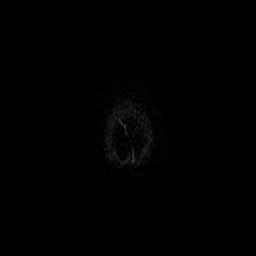

[Series 5: DWI · coronal · 5.0mm · 1.09mm/px · 8 of 81 slices shown (2 of 4)]
[im 1/81]
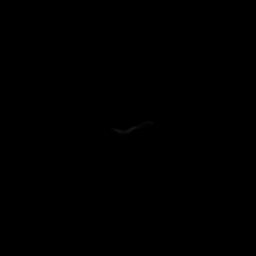
[im 12/81]
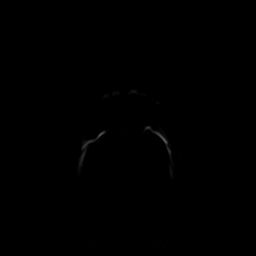
[im 23/81]
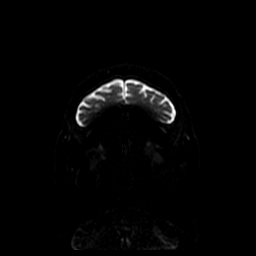
[im 35/81]
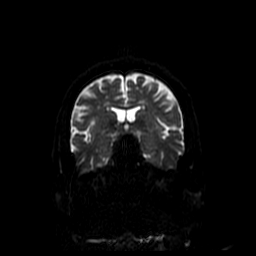
[im 46/81]
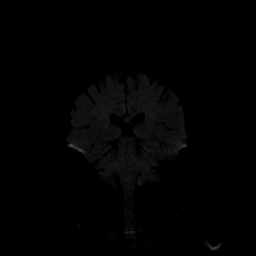
[im 58/81]
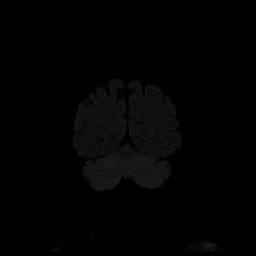
[im 69/81]
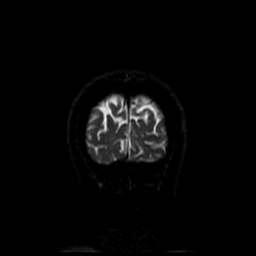
[im 81/81]
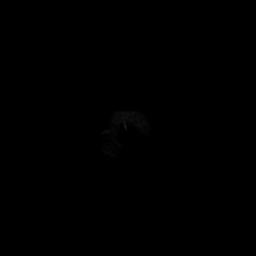

[Series 6: T1 · sagittal · 5.0mm · 0.47mm/px · 3 of 27 slices shown (1 of 2)]
[im 1/27]
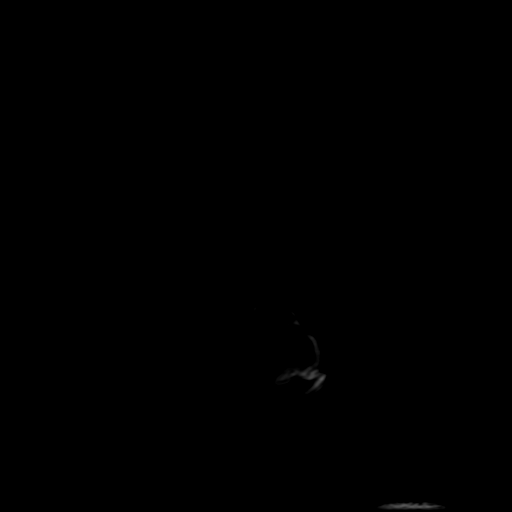
[im 14/27]
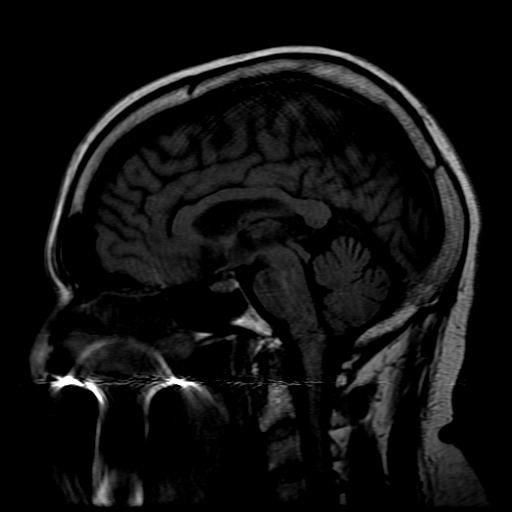
[im 27/27]
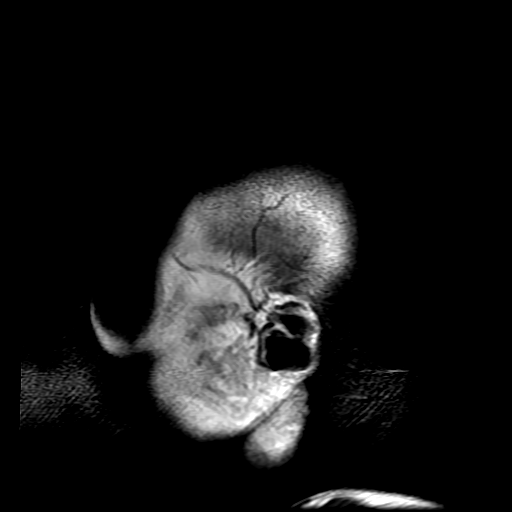

[Series 7: T2 · axial · 5.0mm · 0.43mm/px · z∈[-60,+88]mm · 2 of 26 slices shown (1 of 2)]
[im 1/26]
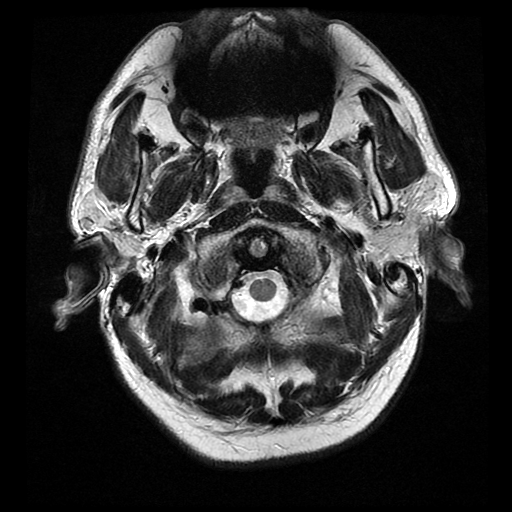
[im 26/26]
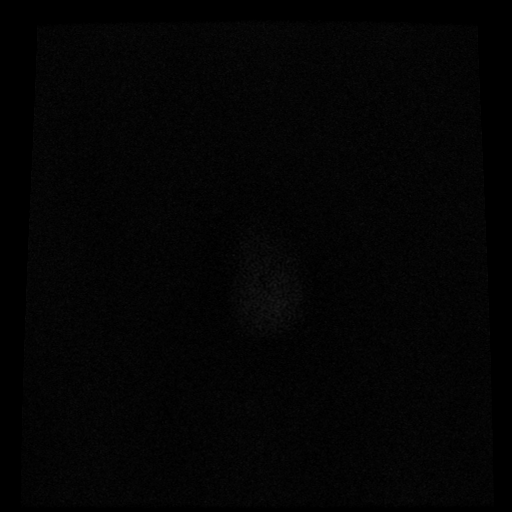

[Series 8: FLAIR · axial · 3.0mm · 0.43mm/px · z∈[-53,+97]mm · 2 of 26 slices shown]
[im 1/26]
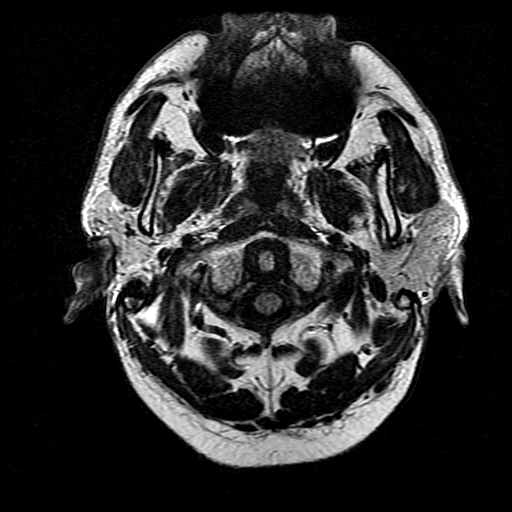
[im 26/26]
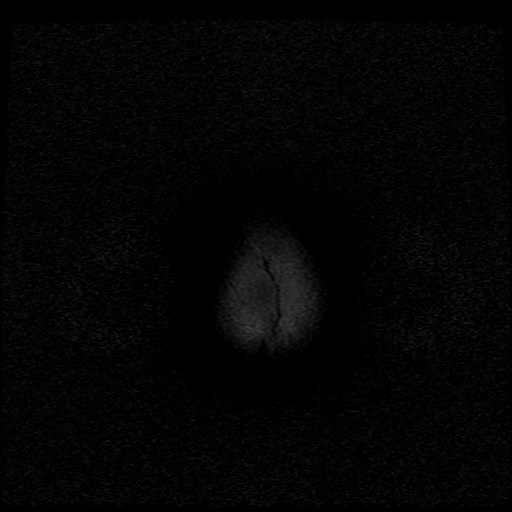

[Series 10: T1 · axial · 3.0mm · 0.47mm/px · z∈[-47,-29]mm · 2 of 100 slices shown (2 of 2)]
[im 1/100]
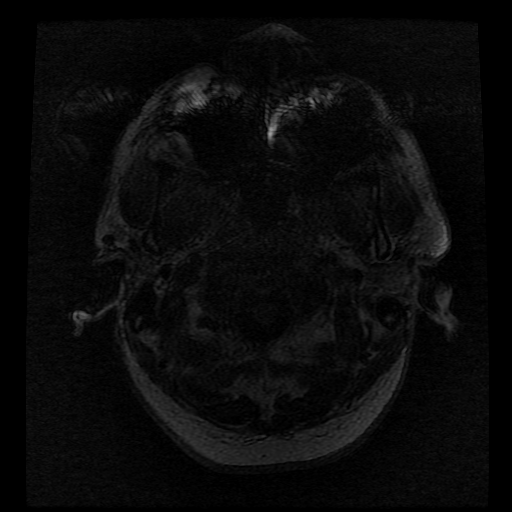
[im 13/100]
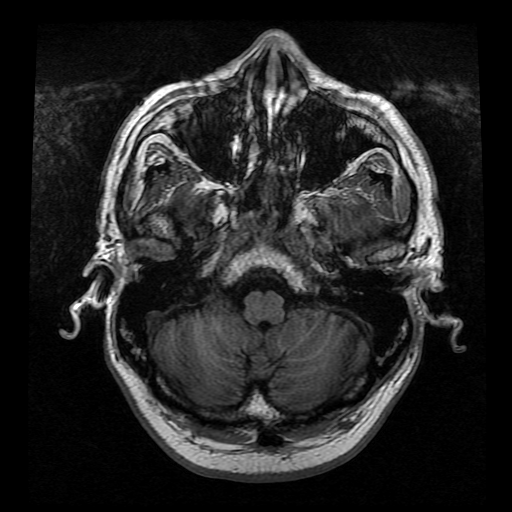

[Series 11: T2 · coronal · 5.0mm · 0.39mm/px · 3 of 31 slices shown (2 of 2)]
[im 1/31]
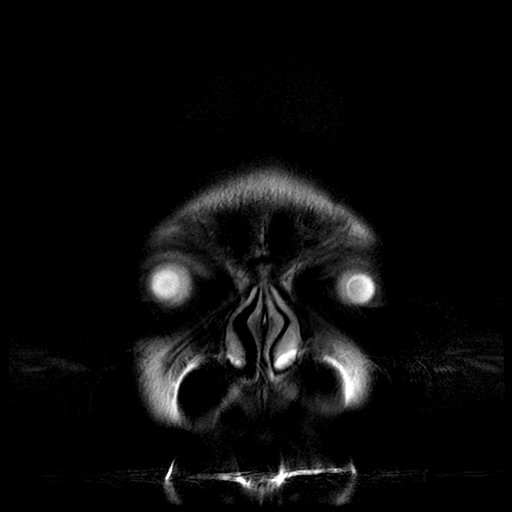
[im 16/31]
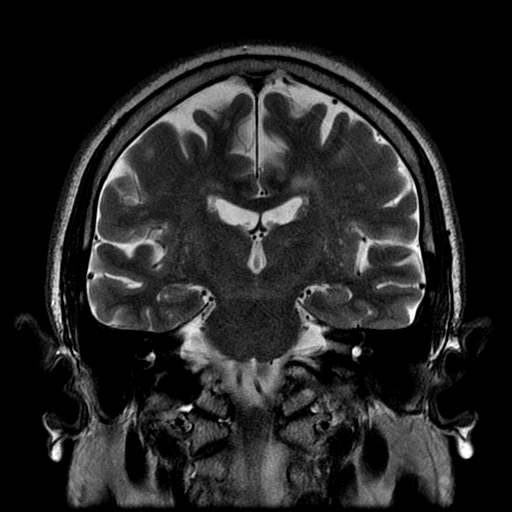
[im 31/31]
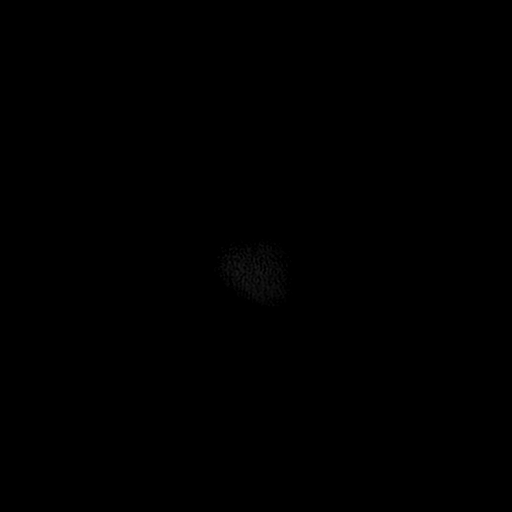

[Series 400: DWI · axial · 3.0mm · 1.09mm/px · z∈[-63,+86]mm · 5 of 51 slices shown (3 of 4)]
[im 1/51]
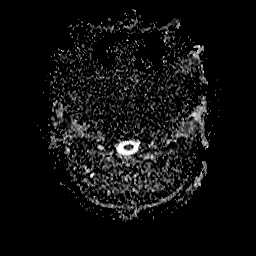
[im 13/51]
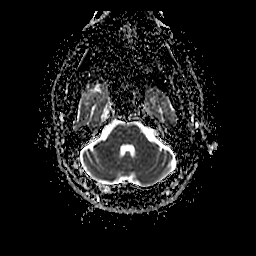
[im 26/51]
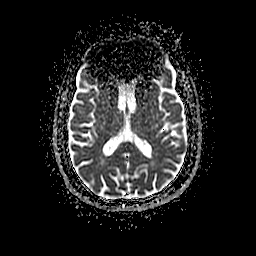
[im 38/51]
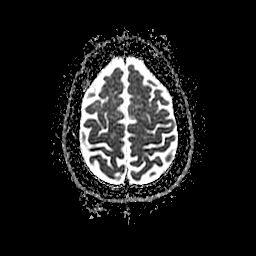
[im 51/51]
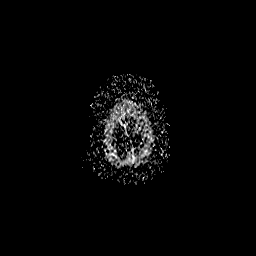

[Series 500: DWI · coronal · 5.0mm · 1.09mm/px · 4 of 41 slices shown (4 of 4)]
[im 1/41]
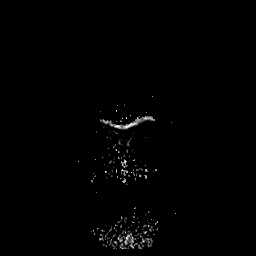
[im 14/41]
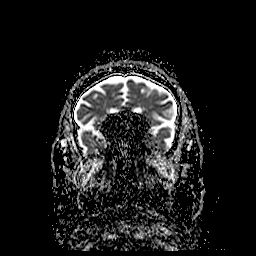
[im 27/41]
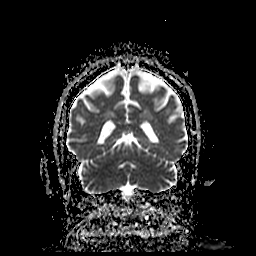
[im 41/41]
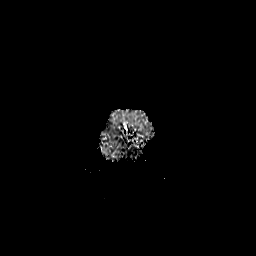

[37 of 48 positions shown; findings below may reference images not displayed]

FINDINGS: Brain: Artifact over the face and frontal lobe due to dental
hardware

Negative for acute infarct.

Ventricle size and cerebral volume normal for age. Numerous
hyperintensities in the deep white matter bilaterally. Brainstem and
cerebellum normal. Negative for hemorrhage or mass.

Vascular: Normal arterial flow voids

Skull and upper cervical spine: Negative

Sinuses/Orbits: Negative

Other: None
IMPRESSION: Negative for acute infarct

Moderate chronic microvascular ischemic changes in the white matter.

## 2021-07-30 ENCOUNTER — Telehealth: Payer: Self-pay

## 2021-07-30 NOTE — Telephone Encounter (Signed)
Attemtped to contact patient to offer 2nd J[employer] vaccination. Left patient a voicemail to return office call. Valarie Cones

## 2023-04-08 ENCOUNTER — Ambulatory Visit: Payer: Medicare HMO | Admitting: Physician Assistant

## 2023-04-08 VITALS — BP 149/64 | HR 64 | Temp 97.8°F | Ht 73.0 in | Wt 190.0 lb

## 2023-04-08 DIAGNOSIS — S30861A Insect bite (nonvenomous) of abdominal wall, initial encounter: Secondary | ICD-10-CM | POA: Diagnosis not present

## 2023-04-08 DIAGNOSIS — I10 Essential (primary) hypertension: Secondary | ICD-10-CM

## 2023-04-08 DIAGNOSIS — Z125 Encounter for screening for malignant neoplasm of prostate: Secondary | ICD-10-CM

## 2023-04-08 DIAGNOSIS — W57XXXA Bitten or stung by nonvenomous insect and other nonvenomous arthropods, initial encounter: Secondary | ICD-10-CM

## 2023-04-08 DIAGNOSIS — F3289 Other specified depressive episodes: Secondary | ICD-10-CM

## 2023-04-08 DIAGNOSIS — Z5900 Homelessness unspecified: Secondary | ICD-10-CM

## 2023-04-08 DIAGNOSIS — D509 Iron deficiency anemia, unspecified: Secondary | ICD-10-CM

## 2023-04-08 LAB — AMB RESULTS CONSOLE CBG: Glucose: 123

## 2023-04-08 MED ORDER — ACETAMINOPHEN 325 MG PO TABS
650.0000 mg | ORAL_TABLET | Freq: Once | ORAL | Status: AC
Start: 2023-04-08 — End: 2023-04-08
  Administered 2023-04-08: 650 mg via ORAL

## 2023-04-08 MED ORDER — DIPHENHYDRAMINE HCL 25 MG PO CAPS
25.0000 mg | ORAL_CAPSULE | Freq: Once | ORAL | Status: AC
Start: 2023-04-08 — End: 2023-04-08
  Administered 2023-04-08: 25 mg via ORAL

## 2023-04-08 NOTE — Patient Instructions (Signed)
You were given Benadryl and Tylenol today.  I encourage you to continue monitoring the area of the bite.  I encourage you to return to the mobile unit next week for further evaluation of your elevated blood pressure readings.  Roney Jaffe, PA-C Physician Assistant [facility] Medicine [email or web address]  Insect Bite, Adult An insect bite can make your skin red, itchy, and swollen. An insect bite is different from an insect sting, which happens when an insect injects poison (venom) into the skin. Some insects can spread disease to people through a bite. However, most insect bites do not lead to disease and are not serious. What are the causes? Insects may bite for a variety of reasons, including: Hunger. To defend themselves. Insects that bite include: Spiders. Mosquitoes and flies. Ticks and fleas. Ants. Kissing bugs. Chiggers. What are the signs or symptoms? In many cases, symptoms last for 2-4 days. However, itching can last up to 10 days. Symptoms include: Itching or pain in the bite area. Redness and swelling in the bite area. An open wound (skin ulcer). In rare cases, a person may have a severe allergic reaction (anaphylactic reaction) to a bite. Symptoms of an anaphylactic reaction may include: Feeling warm in the face (flushed). This may include redness. Itchy, red, swollen areas of skin (hives). Swelling of the eyes, lips, face, mouth, tongue, or throat. Wheezing or difficulty breathing, speaking, or swallowing. Dizziness, light-headedness, or fainting. Abdominal symptoms like cramping, nausea, vomiting, or diarrhea. How is this diagnosed? This condition is usually diagnosed based on symptoms and a physical exam. During the exam, your health care provider will look at the bite and ask you what kind of insect bit you. How is this treated? Most insect bites are not serious. Symptoms often go away on their own and treatment  is not usually needed. When treatment is recommended, it may include: Applying ice to the affected area. Applying steroid or other anti-itch creams, like calamine lotion, to the bite area. Medicines called antihistamines to reduce itching. You may also need: A tetanus shot if you are not up to date. Antibiotic cream or an oral antibiotic if the bite becomes infected (this is uncommon). Follow these instructions at home: Bite area care  Do not scratch the bite area. It may help to cover the bite area with a bandage or close-fitting clothing. Keep the bite area clean and dry. Wash it every day with soap and water as told by your health care provider. Check the bite area every day for signs of infection. Check for: More redness, swelling, or pain. Fluid or blood. Warmth. Pus or a bad smell. Managing pain, itching, and swelling  You may apply cortisone cream, calamine lotion, or a paste made of baking soda and water to the bite area as told by your health care provider. If directed, put ice on the bite area. To do this: Put ice in a plastic bag. Place a towel between your skin and the bag. Leave the ice on for 20 minutes, 2-3 times a day. If your skin turns bright red, remove the ice right away to prevent skin damage. The risk of skin damage is higher if you cannot feel pain, heat, or cold. General instructions Apply or take over-the-counter and prescription medicine only as told by your health care provider. If you were prescribed antibiotics, take or apply them as told by your health care provider. Do not stop using the antibiotic even if you start to feel better. How is  this prevented? To help reduce your risk of insect bites: When you are outdoors, wear clothing that covers your arms and legs. This is especially important in the early morning and evening. Use insect repellent. The best insect repellents contain DEET, picaridin, oil of lemon eucalyptus (OLE), or IR3535. Consider  spraying your clothing with a pesticide called permethrin. Permethrin helps prevent insect bites. It works for several weeks and for up to 5-6 clothing washes. Do not apply permethrin directly to the skin. If your home windows do not have screens, consider installing them. If you will be sleeping in an area where there are mosquitoes, consider covering your sleeping area with a mosquito net. Contact a health care provider if: Your bite area has signs of infection, such as: More redness, swelling, or pain. Fluid or blood. Warmth. Pus or a bad smell. You have a fever. Get help right away if: You have a rash. You have muscle or joint pain. You feel unusually tired or weak. You have neck pain or a headache. You develop symptoms of an anaphylactic reaction. These may include: Swelling of the eyes, lips, face, mouth, tongue, or throat. Flushed skin or hives. Wheezing. Difficulty breathing, speaking, or swallowing. Dizziness, light-headedness, or fainting. Abdominal pain, cramping, vomiting, or diarrhea. These symptoms may be an emergency. Get help right away. Call 911. Do not wait to see if the symptoms will go away. Do not drive yourself to the hospital. Summary An insect bite can make your skin red, itchy, and swollen. Treatment is usually not needed. Symptoms often go away on their own. When treatment is recommended, it may involve taking medicine, applying medicine to the area, or applying ice. Apply or take over-the-counter and prescription medicines only as told by your health care provider. Use insect repellent to help prevent insect bites. Contact a health care provider if your bite area has signs of infection. This information is not intended to replace advice given to you by your health care provider. Make sure you discuss any questions you have with your health care provider. Document Revised: 12/11/2021 Document Reviewed: 11/26/2021 Elsevier Patient Education  2024 [employer].

## 2023-04-08 NOTE — Progress Notes (Signed)
Pt has PCP at [facility]. Pt recommended to see provider on bus today about blood pressure. Pt has many SDOH needs, given all the resources.
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
## 2023-04-09 ENCOUNTER — Encounter: Payer: Self-pay | Admitting: Physician Assistant

## 2023-04-09 DIAGNOSIS — I10 Essential (primary) hypertension: Secondary | ICD-10-CM | POA: Insufficient documentation

## 2023-04-22 ENCOUNTER — Encounter: Payer: Self-pay | Admitting: *Deleted

## 2023-04-22 NOTE — Progress Notes (Signed)
Pt attended 04/08/23 screening event where his b/p was 166/95 and his blood sugar was 123. At the event, the pt identified his PCP as [facility] and noted multiple SDOH insecurities, including food, housing, and transportation,  for which he was given resources at the event. Pt also noted having no phone at the event. The event RN recommended the pt see the provider on the mobile medical unit at the event due to his elevated blood pressure, which pt did. Cari Mayers, PA-C, conducted an office visit on the mobile unit, which is documented in [facility]. Since pt did not have a phone, this event f/u included calling the [facility] office in [place] (closest to pt's previous address) and the clinic confirmed the pt was established with Rosita Fire, NP, whom pt confirmed he had seen approx 3 months ago. Pt also shared that [facility] about [facility]. VA care management documentation also visible in [facility], most recent notes by Collier Flowers, MSW, LCSW, on 03/29/23. Screening v/s and mobile unit info were mailed to pt's PCP for follow up.

## 2023-05-16 ENCOUNTER — Emergency Department (HOSPITAL_COMMUNITY): Payer: Medicare HMO

## 2023-05-16 ENCOUNTER — Other Ambulatory Visit: Payer: Self-pay

## 2023-05-16 ENCOUNTER — Encounter (HOSPITAL_COMMUNITY): Payer: Self-pay

## 2023-05-16 ENCOUNTER — Emergency Department (HOSPITAL_COMMUNITY)
Admission: EM | Admit: 2023-05-16 | Discharge: 2023-05-16 | Disposition: A | Discharge status: Home / Self Care | Payer: Medicare HMO | Attending: Emergency Medicine | Admitting: Emergency Medicine

## 2023-05-16 DIAGNOSIS — R079 Chest pain, unspecified: Secondary | ICD-10-CM | POA: Insufficient documentation

## 2023-05-16 DIAGNOSIS — I10 Essential (primary) hypertension: Secondary | ICD-10-CM | POA: Insufficient documentation

## 2023-05-16 DIAGNOSIS — E119 Type 2 diabetes mellitus without complications: Secondary | ICD-10-CM | POA: Insufficient documentation

## 2023-05-16 DIAGNOSIS — Z79899 Other long term (current) drug therapy: Secondary | ICD-10-CM | POA: Diagnosis not present

## 2023-05-16 DIAGNOSIS — G43809 Other migraine, not intractable, without status migrainosus: Secondary | ICD-10-CM | POA: Insufficient documentation

## 2023-05-16 LAB — CBC
HCT: 39.9 % (ref 39.0–52.0)
Hemoglobin: 12.7 g/dL — ABNORMAL LOW (ref 13.0–17.0)
MCH: 24.4 pg — ABNORMAL LOW (ref 26.0–34.0)
MCHC: 31.8 g/dL (ref 30.0–36.0)
MCV: 76.6 fL — ABNORMAL LOW (ref 80.0–100.0)
Platelets: 211 10*3/uL (ref 150–400)
RBC: 5.21 MIL/uL (ref 4.22–5.81)
RDW: 19.3 % — ABNORMAL HIGH (ref 11.5–15.5)
WBC: 3.6 10*3/uL — ABNORMAL LOW (ref 4.0–10.5)
nRBC: 0 % (ref 0.0–0.2)

## 2023-05-16 LAB — BASIC METABOLIC PANEL
Anion gap: 13 (ref 5–15)
BUN: 20 mg/dL (ref 8–23)
CO2: 23 mmol/L (ref 22–32)
Calcium: 9.5 mg/dL (ref 8.9–10.3)
Chloride: 100 mmol/L (ref 98–111)
Creatinine, Ser: 1.46 mg/dL — ABNORMAL HIGH (ref 0.61–1.24)
GFR, Estimated: 52 mL/min — ABNORMAL LOW (ref >60)
Glucose, Bld: 88 mg/dL (ref 70–99)
Potassium: 4.5 mmol/L (ref 3.5–5.1)
Sodium: 136 mmol/L (ref 135–145)

## 2023-05-16 LAB — TROPONIN I (HIGH SENSITIVITY)
Troponin I (High Sensitivity): 11 ng/L (ref <18)
Troponin I (High Sensitivity): 12 ng/L (ref <18)

## 2023-05-16 MED ORDER — PROCHLORPERAZINE EDISYLATE 10 MG/2ML IJ SOLN
10.0000 mg | Freq: Once | INTRAMUSCULAR | Status: AC
  Administered 2023-05-16: 10 mg via INTRAVENOUS
  Filled 2023-05-16: qty 2

## 2023-05-16 MED ORDER — ALBUTEROL SULFATE HFA 108 (90 BASE) MCG/ACT IN AERS
2.0000 | INHALATION_SPRAY | Freq: Once | RESPIRATORY_TRACT | Status: AC
  Administered 2023-05-16: 2 via RESPIRATORY_TRACT
  Filled 2023-05-16: qty 6.7

## 2023-05-16 MED ORDER — ACETAMINOPHEN 325 MG PO TABS
650.0000 mg | ORAL_TABLET | Freq: Once | ORAL | Status: AC
  Administered 2023-05-16: 650 mg via ORAL
  Filled 2023-05-16: qty 2

## 2023-05-16 MED ORDER — NITROGLYCERIN 0.4 MG SL SUBL: 0.4000 mg | SUBLINGUAL_TABLET | SUBLINGUAL | Status: AC

## 2023-05-16 MED ORDER — IOHEXOL 350 MG/ML SOLN
75.0000 mL | Freq: Once | INTRAVENOUS | Status: AC | PRN
  Administered 2023-05-16: 75 mL via INTRAVENOUS

## 2023-05-16 NOTE — ED Triage Notes (Signed)
Pt was bib ems from [facility] c/o stroke like symptoms but upon arrival pt was c/o chest pain, numbness in left arm, and weakness. Hx HTN Pt states he has been out of his BP medication for 3 days.  BP 156/98 HR 60 CBG 86 RA 96%

## 2023-05-16 NOTE — ED Provider Notes (Signed)
Chest pain with some paresthesias to the left upper and lower extremity.  Last known normal 10 AM.  Pending repeat troponin MRI.  Patient reevaluated after negative MRI.  Continues to have chest pain and paresthesias to left arm.  Endorsing headache.  Given his elevated blood pressure concern for possible dissection.  Will get CTA to further evaluate also will treat migraine with Compazine and Tylenol.  He is complaining of some shortness of breath, will give him albuterol.  Physical Exam  BP (!) 170/90 (BP Location: Left Arm)   Pulse 60   Temp 98.1 F (36.7 C) (Oral)   Resp 18   Ht 6' (1.829 m)   Wt 86.2 kg   SpO2 98%   BMI 25.77 kg/m   Physical Exam Vitals and nursing note reviewed.  HENT:     Head: Normocephalic.  Cardiovascular:     Rate and Rhythm: Normal rate and regular rhythm.  Pulmonary:     Breath sounds: Normal breath sounds.  Musculoskeletal:     Right lower leg: No edema.     Left lower leg: No edema.  Skin:    General: Skin is warm and dry.     Capillary Refill: Capillary refill takes less than 2 seconds.  Neurological:     General: No focal deficit present.     Mental Status: He is alert.     Comments: Does have some subjective feeling difference in his left upper and left lower extremity.  But no gross motor deficits.  Coordinated movements.     Procedures  Procedures  ED Course / MDM    Medical Decision Making Patient has extensive workup today MRI negative for acute stroke.  EKG and troponins negative for ACS.  No fever tachycardia leukocytosis to suggest systemic infection.  No significant metabolic derangements.  Obtained CTA of chest to rule out dissection it was negative.  Patient's symptoms resolved with migraine cocktail.  Stable for discharge.  There was concern that patient is homeless, however he states he is staying at the [place] [facility] and does not feel that he would have any issue getting to outpatient cardiology.  Patient given return  precautions.  Stable for discharge at this time.  Amount and/or Complexity of Data Reviewed Labs: ordered. Radiology: ordered.  Risk OTC drugs. Prescription drug management.        Coral Spikes, DO 05/16/23 2043

## 2023-05-16 NOTE — ED Provider Notes (Signed)
[place] EMERGENCY DEPARTMENT AT [facility] Provider Note   CSN: [id number] Arrival date & time: 05/16/23  1203     History  Chief Complaint  Patient presents with   Chest Pain    Timothy Donovan is a 67 y.o. male.  HPI    Pt comes in w/ cc of chest pain. Pt has hx of htn, dm. Chest pain is central, non radiating, constant x 2 days. No exertional chest pain, but he has had exertional shob. He reports associated numbness to the LUE and left lower extremity which is also new.  Patient was last known normal at 10 AM.  Patient denies any heavy tobacco use, substance use disorder.  He indicates that he has a sister who had heart attack in her 63s.  Additionally, patient also indicates that he was diaphoretic.   Home Medications Prior to Admission medications   Medication Sig Start Date End Date Taking? Authorizing Provider  diclofenac sodium (VOLTAREN) 1 % GEL Apply 2 g topically 4 (four) times daily. Patient not taking: Reported on 04/08/2023 05/26/19   Joseph Art, DO  isoniazid (NYDRAZID) 300 MG tablet Take 300 mg by mouth daily. Take with vitamin B6 (Pyridoxine) Patient not taking: Reported on 04/08/2023    [provider]  lisinopril-hydrochlorothiazide (ZESTORETIC) 20-25 MG tablet Take 1 tablet by mouth daily. 05/30/19   [provider]  pyridoxine (B-6) 100 MG tablet Take 100 mg by mouth daily. Patient not taking: Reported on 04/08/2023    [provider]  tamsulosin (FLOMAX) 0.4 MG CAPS capsule Take 0.4 mg by mouth at bedtime. Patient not taking: Reported on 04/08/2023    [provider]      Allergies    Patient has no known allergies.    Review of Systems   Review of Systems  All other systems reviewed and are negative.   Physical Exam Updated Vital Signs BP (!) 170/90 (BP Location: Left Arm)   Pulse 60   Temp 98.1 F (36.7 C) (Oral)   Resp 18   Ht 6' (1.829 m)   Wt 86.2 kg   SpO2 98%   BMI 25.77 kg/m   Physical Exam Vitals and nursing note reviewed.  Constitutional:      Appearance: He is well-developed.  HENT:     Head: Atraumatic.  Cardiovascular:     Rate and Rhythm: Normal rate.     Heart sounds: Normal heart sounds.  Pulmonary:     Effort: Pulmonary effort is normal.     Breath sounds: Normal breath sounds. No decreased breath sounds.  Musculoskeletal:     Cervical back: Neck supple.  Skin:    General: Skin is warm.  Neurological:     Mental Status: He is alert and oriented to person, place, and time.     Comments: Left upper extremity and lower extremity numbness, subjective. No motor deficits     ED Results / Procedures / Treatments   Labs (all labs ordered are listed, but only abnormal results are displayed) Labs Reviewed  BASIC METABOLIC PANEL - Abnormal; Notable for the following components:      Result Value   Creatinine, Ser 1.46 (*)    GFR, Estimated 52 (*)    All other components within normal limits  CBC - Abnormal; Notable for the following components:   WBC 3.6 (*)    Hemoglobin 12.7 (*)    MCV 76.6 (*)    MCH 24.4 (*)    RDW 19.3 (*)  All other components within normal limits  TROPONIN I (HIGH SENSITIVITY)  TROPONIN I (HIGH SENSITIVITY)    EKG EKG Interpretation Date/Time:  Saturday May 16 2023 12:43:41 EDT Ventricular Rate:  57 PR Interval:  123 QRS Duration:  139 QT Interval:  453 QTC Calculation: 442 R Axis:   89  Text Interpretation: Sinus rhythm Right bundle branch block ST elevation suggests acute pericarditis No acute changes No significant change since last tracing Confirmed by Derwood Kaplan [phone]) on 05/16/2023 1:02:09 PM  Radiology CT Angio Chest Aorta W and/or Wo Contrast  Result Date: 05/16/2023 CLINICAL DATA:  Chest pain, left-sided numbness EXAM: CT ANGIOGRAPHY CHEST WITH CONTRAST TECHNIQUE: Multidetector CT imaging of the chest was performed using the standard protocol during bolus administration of intravenous  contrast. Multiplanar CT image reconstructions and MIPs were obtained to evaluate the vascular anatomy. RADIATION DOSE REDUCTION: This exam was performed according to the departmental dose-optimization program which includes automated exposure control, adjustment of the mA and/or kV according to patient size and/or use of iterative reconstruction technique. CONTRAST:  75mL OMNIPAQUE IOHEXOL 350 MG/ML SOLN COMPARISON:  05/16/2023, 05/25/2019 FINDINGS: Cardiovascular: No evidence of thoracic aortic aneurysm or dissection. The heart is unremarkable without pericardial effusion. There is adequate opacification of the pulmonary vasculature. No filling defects or pulmonary emboli. Mediastinum/Nodes: No enlarged mediastinal, hilar, or axillary lymph nodes. Thyroid gland, trachea, and esophagus demonstrate no significant findings. Lungs/Pleura: No acute airspace disease, effusion, or pneumothorax. There is mild reticulonodular prominence throughout the lungs, with subtle heterogeneous ground-glass appearance throughout the lungs. Findings could reflect underlying inflammatory bronchiolitis with air trapping related to small airway disease. Central airways are patent. Upper Abdomen: No acute abnormality. Musculoskeletal: No acute or destructive bony abnormalities. Reconstructed images demonstrate no additional findings. Review of the MIP images confirms the above findings. IMPRESSION: 1. No evidence of thoracic aortic aneurysm or dissection. 2. No evidence of pulmonary embolus. 3. Pattern of findings in the lungs suggesting bronchiolitis with air trapping due to small airway disease. Please correlate with any history of tobacco abuse. Pulmonology follow-up may be useful. Electronically Signed   By: Sharlet Salina M.D.   On: 05/16/2023 20:22   MR BRAIN WO CONTRAST  Result Date: 05/16/2023 CLINICAL DATA:  Initial evaluation for neuro deficit, stroke suspected. EXAM: MRI HEAD WITHOUT CONTRAST TECHNIQUE: Multiplanar,  multiecho pulse sequences of the brain and surrounding structures were obtained without intravenous contrast. COMPARISON:  CT from earlier the same day. FINDINGS: Brain: Cerebral volume within normal limits. Patchy T2/FLAIR hyperintensity involving the supratentorial cerebral white matter, consistent with chronic small vessel ischemic disease, moderately advanced in nature. No evidence for acute or subacute ischemia. Gray-white matter differentiation maintained. No acute or chronic intracranial blood products. No mass lesion, midline shift or mass effect. No hydrocephalus or extra-axial fluid collection. Pituitary gland suprasellar region within normal limits. Vascular: Major intracranial vascular flow voids are maintained. Skull and upper cervical spine: Craniocervical junction within normal limits. Bone marrow signal intensity within normal limits. T1/T2 hypointense sclerotic lesion noted at the left frontal calvarium, nonspecific, but likely benign. No scalp soft tissue abnormality. Sinuses/Orbits: Prior ocular lens replacement on the right. Globes and orbital soft tissues demonstrate no acute finding. Paranasal sinuses are largely clear. No mastoid effusion. Other: None. IMPRESSION: 1. No acute intracranial abnormality. 2. Moderately advanced chronic microvascular ischemic disease. Electronically Signed   By: Rise Mu M.D.   On: 05/16/2023 18:07   CT Head Wo Contrast  Result Date: 05/16/2023 CLINICAL DATA:  Neuro deficit.  Acute stroke suspected EXAM: CT HEAD WITHOUT CONTRAST TECHNIQUE: Contiguous axial images were obtained from the base of the skull through the vertex without intravenous contrast. RADIATION DOSE REDUCTION: This exam was performed according to the departmental dose-optimization program which includes automated exposure control, adjustment of the mA and/or kV according to patient size and/or use of iterative reconstruction technique. COMPARISON:  05/25/2019 CT and MR. FINDINGS:  Brain: Moderate low density in the periventricular white matter likely related to small vessel disease. Cerebral atrophy may be minimally age advanced. No mass lesion, hemorrhage, hydrocephalus, acute infarct, intra-axial, or extra-axial fluid collection. Vascular: No hyperdense vessel or unexpected calcification. Skull: Normal Sinuses/Orbits: Normal imaged portions of the orbits and globes. Clear paranasal sinuses and mastoid air cells. Other: None. IMPRESSION: 1.  No acute intracranial abnormality. 2.  Cerebral atrophy and small vessel ischemic change. Electronically Signed   By: Jeronimo Greaves M.D.   On: 05/16/2023 16:11   DG Chest 2 View  Result Date: 05/16/2023 CLINICAL DATA:  Chest pain and left-sided numbness. EXAM: CHEST - 2 VIEW COMPARISON:  Chest radiographs 11/07/2022 FINDINGS: Cardiac silhouette and mediastinal contours are within normal limits. The lungs are clear. No pleural effusion or pneumothorax. Mild multilevel degenerative disc changes of the thoracic spine. IMPRESSION: No active cardiopulmonary disease. Electronically Signed   By: Neita Garnet M.D.   On: 05/16/2023 13:43    Procedures Procedures    Medications Ordered in ED Medications  nitroGLYCERIN (NITROSTAT) SL tablet 0.4 mg ( Sublingual Canceled Entry 05/16/23 1624)  prochlorperazine (COMPAZINE) injection 10 mg (10 mg Intravenous Given 05/16/23 1831)  acetaminophen (TYLENOL) tablet 650 mg (650 mg Oral Given 05/16/23 1830)  albuterol (VENTOLIN HFA) 108 (90 Base) MCG/ACT inhaler 2 puff (2 puffs Inhalation Given 05/16/23 1830)  iohexol (OMNIPAQUE) 350 MG/ML injection 75 mL (75 mLs Intravenous Contrast Given 05/16/23 1904)    ED Course/ Medical Decision Making/ A&P                                 Medical Decision Making Amount and/or Complexity of Data Reviewed Labs: ordered. Radiology: ordered.  Risk OTC drugs. Prescription drug management.   66 year old male comes in with chief complaint of chest pain.  In the ER,  in addition to left-sided chest pain he is complaining of left-sided upper extremity numbness.  Patient has history of hypertension.  He denies any heavy tobacco use disorder and denies any substance use disorder.  The complaint involves an extensive differential diagnosis and also carries with it a high risk of complications and morbidity.    The differential diagnosis considered for this patient includes  ACS syndrome Aortic dissection Pneumonia Pleural effusion / Pulmonary edema PE Pneumothorax Musculoskeletal pain PUD / Gastritis / Esophagitis Esophageal spasm  He is also complaining of left-sided numbness.  Therefore differential includes acute ischemic stroke.  The initial plan is to get delta troponin, MRI of the brain.   Additional history obtained: Records reviewed previous admission documents. It appears that patient is homeless.  In the past he was admitted for left-sided weakness.  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  MRI was negative at that time.  Independent labs interpretation:  The following labs were independently interpreted: Initial troponin is reassuring. Initial labs are reassuring.  Independent visualization and interpretation of imaging: - I independently visualized the following imaging with scope of interpretation limited to determining acute life threatening conditions related to emergency care: X-ray of the chest,  which revealed no pneumothorax.   Patient's care signed out to incoming team. It delta trop is neg, MRI is neg - can be discharged with cards f/u.   Final Clinical Impression(s) / ED Diagnoses Final diagnoses:  Chest pain, unspecified type  Other migraine without status migrainosus, not intractable    Rx / DC Orders ED Discharge Orders     None         Derwood Kaplan, MD 05/16/23 2353

## 2023-05-16 NOTE — ED Notes (Signed)
Pt requested to contact Theodoro Doing case manager [phone] ext [phone]

## 2023-05-16 NOTE — Discharge Instructions (Signed)
Please follow-up with your primary doctor.  Please follow-up with cardiology as discussed.  Turn immediately if develop fevers, chills, headache, vision changes, worsening chest pain, shortness of breath, unilateral weakness, difficulty finding words or any new or worsening symptoms that are concerning to you.

## 2023-06-29 ENCOUNTER — Encounter: Payer: Self-pay | Admitting: *Deleted
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
## 2023-07-02 ENCOUNTER — Encounter: Payer: Self-pay | Admitting: Physician Assistant

## 2023-07-02 ENCOUNTER — Ambulatory Visit: Payer: Medicare HMO | Admitting: Physician Assistant

## 2023-07-02 VITALS — BP 142/85 | HR 65 | Resp 16

## 2023-07-02 DIAGNOSIS — Z5900 Homelessness unspecified: Secondary | ICD-10-CM | POA: Diagnosis not present

## 2023-07-02 DIAGNOSIS — N401 Enlarged prostate with lower urinary tract symptoms: Secondary | ICD-10-CM

## 2023-07-02 DIAGNOSIS — R7303 Prediabetes: Secondary | ICD-10-CM

## 2023-07-02 DIAGNOSIS — R351 Nocturia: Secondary | ICD-10-CM

## 2023-07-02 DIAGNOSIS — I10 Essential (primary) hypertension: Secondary | ICD-10-CM | POA: Diagnosis not present

## 2023-07-02 LAB — POCT GLYCOSYLATED HEMOGLOBIN (HGB A1C): HbA1c, POC (prediabetic range): 5.9 % — AB (ref 5.7–6.4)

## 2023-07-02 LAB — AMB RESULTS CONSOLE CBG: Glucose: 138

## 2023-07-02 MED ORDER — LISINOPRIL-HYDROCHLOROTHIAZIDE 20-25 MG PO TABS: 1.0000 | ORAL_TABLET | Freq: Every day | ORAL | 1 refills | Status: AC

## 2023-07-02 MED ORDER — TAMSULOSIN HCL 0.4 MG PO CAPS: 0.4000 mg | ORAL_CAPSULE | Freq: Every day | ORAL | 1 refills | Status: AC

## 2023-07-02 NOTE — Progress Notes (Signed)
Pt presents for hypertension

## 2023-07-02 NOTE — Progress Notes (Signed)
Patient ID: Timothy Donovan, male   DOB: 07/18/1956, 67 y.o.   MRN: [id number]   Timothy Donovan, is a 67 y.o. male  YQM:[id number]  BMW:[id number]  DOB - 04/08/1956  Chief Complaint  Patient presents with   Hypertension       Subjective:   Timothy Donovan is a 67 y.o. male here today for a follow up visit and to establish care. Patient has No headache, No chest pain, No abdominal pain - No Nausea, No new weakness tingling or numbness, No Cough - SOB.  Onset: Location: Duration: Characterization/quality: Alleviating/aggravating: Radiation: Severity:  No problems updated.  ALLERGIES: No Known Allergies  PAST MEDICAL HISTORY: Past Medical History:  Diagnosis Date   Diabetes mellitus without complication (HCC)    "pre diabetes"    Glaucoma    Right eye   Hypertension     MEDICATIONS AT HOME: Prior to Admission medications   Medication Sig Start Date End Date Taking? Authorizing Provider  diclofenac sodium (VOLTAREN) 1 % GEL Apply 2 g topically 4 (four) times daily. Patient not taking: Reported on 04/08/2023 05/26/19   Joseph Art, DO  isoniazid (NYDRAZID) 300 MG tablet Take 300 mg by mouth daily. Take with vitamin B6 (Pyridoxine)    [provider]  lisinopril-hydrochlorothiazide (ZESTORETIC) 20-25 MG tablet Take 1 tablet by mouth daily. 07/02/23   Anders Simmonds, PA-C  pyridoxine (B-6) 100 MG tablet Take 100 mg by mouth daily. Patient not taking: Reported on 04/08/2023    [provider]  tamsulosin (FLOMAX) 0.4 MG CAPS capsule Take 1 capsule (0.4 mg total) by mouth at bedtime. 07/02/23   Tymira Horkey, Marzella Schlein, PA-C    ROS: Neg HEENT Neg resp Neg cardiac Neg GI Neg GU Neg MS Neg psych Neg neuro  Objective:   Vitals:   07/02/23 1500 07/02/23 1523  BP: (!) 172/84 (!) 142/85  Pulse:  65  Resp:  16  SpO2:  97%   Exam General appearance : Awake, alert, not in any distress. Speech Clear. Not toxic looking HEENT: Atraumatic and Normocephalic,  pupils equally reactive to light and accomodation Neck: Supple, no JVD. No cervical lymphadenopathy.  Chest: Good air entry bilaterally, CTAB.  No rales/rhonchi/wheezing CVS: S1 S2 regular, no murmurs.  Abdomen: Bowel sounds present, Non tender and not distended with no gaurding, rigidity or rebound. Extremities: B/L Lower Ext shows no edema, both legs are warm to touch Neurology: Awake alert, and oriented X 3, CN II-XII intact, Non focal Skin: No Rash  Data Review Lab Results  Component Value Date   HGBA1C 5.9 (A) 07/02/2023   HGBA1C 5.8 (H) 05/26/2019    Assessment & Plan   Prediabetes I have advised the patient to work at a goal of eliminating sugary drinks, candy, desserts, sweets, refined sugars, processed foods, and white carbohydrates.  - POCT glycosylated hemoglobin (Hb A1C)  2. Essential hypertension Was out of meds.  Resume meds - lisinopril-hydrochlorothiazide (ZESTORETIC) 20-25 MG tablet; Take 1 tablet by mouth daily.  Dispense: 90 tablet; Refill: 1  3. Homeless Currently working with [facility]  4. Benign prostatic hyperplasia with nocturia - tamsulosin (FLOMAX) 0.4 MG CAPS capsule; Take 1 capsule (0.4 mg total) by mouth at bedtime.  Dispense: 90 capsule; Refill: 1    Return if symptoms worsen or fail to improve, for he has a PCP.  The patient was given clear instructions to go to ER or return to medical center if symptoms don't improve, worsen or new problems develop. The patient verbalized  understanding. The patient was told to call to get lab results if they haven't heard anything in the next week.      Georgian Co, PA-C [facility] [place], [place] [phone]   07/02/2023, 3:32 PM

## 2023-07-02 NOTE — Progress Notes (Signed)
Pt declined SDOH. Does not know PCP name. This nurse recommended pt get seen today on mobile unit on site or be seen at urgent care for blood pressure.

## 2023-07-02 NOTE — Patient Instructions (Signed)

## 2023-08-04 ENCOUNTER — Encounter: Payer: Self-pay | Admitting: *Deleted

## 2023-08-04 NOTE — Progress Notes (Unsigned)
Pt attended 07/02/23 screening event where his b/p was 172/84 and his blood sugar was 138. At the event, the pt was referred to the mobile unit, where he was seen and his b/p was 142/85. Mobile unit PA notes confirm pt still uses the [facility] for his PCP and that pt had run out of meds for which was given refill RX.Further chart review indicates [facility] care management continues to work with pt about homeless resources and pt has a future VA PCP appt on 12/04/23. Phone calls made but unable to leave vm to confirm pt did get b/p med Rx filled.

## 2023-10-07 ENCOUNTER — Encounter: Payer: Self-pay | Admitting: *Deleted
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
